# Patient Record
Sex: Female | Born: 1970 | Race: White | Hispanic: No | Marital: Married | Smoking: Former smoker
Health system: Southern US, Community
[De-identification: ages and names within clinical notes are randomized; demographics above are authoritative.]

## PROBLEM LIST (undated history)

## (undated) DIAGNOSIS — D649 Anemia, unspecified: Secondary | ICD-10-CM

## (undated) DIAGNOSIS — T7840XA Allergy, unspecified, initial encounter: Secondary | ICD-10-CM

## (undated) DIAGNOSIS — E210 Primary hyperparathyroidism: Secondary | ICD-10-CM

## (undated) DIAGNOSIS — R202 Paresthesia of skin: Secondary | ICD-10-CM

## (undated) DIAGNOSIS — K219 Gastro-esophageal reflux disease without esophagitis: Secondary | ICD-10-CM

## (undated) DIAGNOSIS — R011 Cardiac murmur, unspecified: Secondary | ICD-10-CM

## (undated) DIAGNOSIS — F419 Anxiety disorder, unspecified: Secondary | ICD-10-CM

## (undated) DIAGNOSIS — G43909 Migraine, unspecified, not intractable, without status migrainosus: Secondary | ICD-10-CM

## (undated) DIAGNOSIS — C73 Malignant neoplasm of thyroid gland: Secondary | ICD-10-CM

## (undated) DIAGNOSIS — R768 Other specified abnormal immunological findings in serum: Secondary | ICD-10-CM

## (undated) DIAGNOSIS — A6000 Herpesviral infection of urogenital system, unspecified: Secondary | ICD-10-CM

## (undated) DIAGNOSIS — E162 Hypoglycemia, unspecified: Secondary | ICD-10-CM

## (undated) DIAGNOSIS — R7689 Other specified abnormal immunological findings in serum: Secondary | ICD-10-CM

## (undated) DIAGNOSIS — I341 Nonrheumatic mitral (valve) prolapse: Secondary | ICD-10-CM

## (undated) HISTORY — DX: Primary hyperparathyroidism: E21.0

## (undated) HISTORY — DX: Hypoglycemia, unspecified: E16.2

## (undated) HISTORY — PX: FRACTURE SURGERY: SHX138

## (undated) HISTORY — DX: Malignant neoplasm of thyroid gland: C73

## (undated) HISTORY — DX: Allergy, unspecified, initial encounter: T78.40XA

## (undated) HISTORY — DX: Paresthesia of skin: R20.2

## (undated) HISTORY — PX: EYE SURGERY: SHX253

## (undated) HISTORY — DX: Migraine, unspecified, not intractable, without status migrainosus: G43.909

## (undated) HISTORY — DX: Anxiety disorder, unspecified: F41.9

## (undated) HISTORY — DX: Other specified abnormal immunological findings in serum: R76.89

## (undated) HISTORY — DX: Nonrheumatic mitral (valve) prolapse: I34.1

## (undated) HISTORY — DX: Gastro-esophageal reflux disease without esophagitis: K21.9

## (undated) HISTORY — DX: Other specified abnormal immunological findings in serum: R76.8

## (undated) HISTORY — DX: Cardiac murmur, unspecified: R01.1

## (undated) HISTORY — PX: URETHRA SURGERY: SHX824

## (undated) HISTORY — DX: Anemia, unspecified: D64.9

## (undated) HISTORY — DX: Herpesviral infection of urogenital system, unspecified: A60.00

---
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
## 2010-02-03 ENCOUNTER — Encounter
Admission: RE | Admit: 2010-02-03 | Discharge: 2010-02-03 | Payer: Self-pay | Source: Home / Self Care | Attending: Internal Medicine | Admitting: Internal Medicine

## 2010-03-14 ENCOUNTER — Institutional Professional Consult (permissible substitution) (INDEPENDENT_AMBULATORY_CARE_PROVIDER_SITE_OTHER): Payer: BC Managed Care – PPO | Admitting: Cardiology

## 2010-03-14 DIAGNOSIS — R079 Chest pain, unspecified: Secondary | ICD-10-CM

## 2010-03-14 DIAGNOSIS — I059 Rheumatic mitral valve disease, unspecified: Secondary | ICD-10-CM

## 2010-03-22 ENCOUNTER — Other Ambulatory Visit (HOSPITAL_COMMUNITY): Payer: BC Managed Care – PPO

## 2010-03-29 ENCOUNTER — Other Ambulatory Visit (HOSPITAL_COMMUNITY): Payer: BC Managed Care – PPO

## 2010-04-14 ENCOUNTER — Ambulatory Visit (HOSPITAL_COMMUNITY): Payer: BC Managed Care – PPO | Attending: Cardiology

## 2010-04-14 DIAGNOSIS — J45909 Unspecified asthma, uncomplicated: Secondary | ICD-10-CM | POA: Insufficient documentation

## 2010-04-14 DIAGNOSIS — R072 Precordial pain: Secondary | ICD-10-CM

## 2010-04-14 DIAGNOSIS — R079 Chest pain, unspecified: Secondary | ICD-10-CM | POA: Insufficient documentation

## 2010-09-06 ENCOUNTER — Encounter: Payer: Self-pay | Admitting: Internal Medicine

## 2010-09-06 ENCOUNTER — Ambulatory Visit (INDEPENDENT_AMBULATORY_CARE_PROVIDER_SITE_OTHER): Payer: BC Managed Care – PPO | Admitting: Internal Medicine

## 2010-09-06 DIAGNOSIS — J45909 Unspecified asthma, uncomplicated: Secondary | ICD-10-CM

## 2010-09-06 DIAGNOSIS — Z0000 Encounter for general adult medical examination without abnormal findings: Secondary | ICD-10-CM

## 2010-09-06 DIAGNOSIS — K219 Gastro-esophageal reflux disease without esophagitis: Secondary | ICD-10-CM

## 2010-09-06 LAB — HEPATIC FUNCTION PANEL
ALT: 25 U/L (ref 0–35)
Alkaline Phosphatase: 45 U/L (ref 39–117)
Bilirubin, Direct: 0.1 mg/dL (ref 0.0–0.3)
Total Protein: 7.4 g/dL (ref 6.0–8.3)

## 2010-09-06 LAB — CBC WITH DIFFERENTIAL/PLATELET
Eosinophils Absolute: 0 10*3/uL (ref 0.0–0.7)
Eosinophils Relative: 1 % (ref 0.0–5.0)
Lymphocytes Relative: 38.7 % (ref 12.0–46.0)
MCV: 92.5 fl (ref 78.0–100.0)
Monocytes Absolute: 0.4 10*3/uL (ref 0.1–1.0)
Neutrophils Relative %: 50.1 % (ref 43.0–77.0)
Platelets: 170 10*3/uL (ref 150.0–400.0)
WBC: 3.9 10*3/uL — ABNORMAL LOW (ref 4.5–10.5)

## 2010-09-06 LAB — BASIC METABOLIC PANEL
BUN: 14 mg/dL (ref 6–23)
Creatinine, Ser: 0.8 mg/dL (ref 0.4–1.2)
GFR: 79.92 mL/min (ref >60.00)

## 2010-09-06 LAB — LIPID PANEL
Cholesterol: 165 mg/dL (ref 0–200)
LDL Cholesterol: 94 mg/dL (ref 0–99)
Triglycerides: 60 mg/dL (ref 0.0–149.0)

## 2010-09-06 MED ORDER — RANITIDINE HCL 150 MG PO TABS: 150.0000 mg | ORAL_TABLET | Freq: Two times a day (BID) | ORAL | Status: DC

## 2010-09-06 NOTE — Progress Notes (Signed)
Addended byMarga Melnick F on: 09/06/2010 10:37 AM   Modules accepted: Orders

## 2010-09-06 NOTE — Patient Instructions (Addendum)
The triggers for dyspepsia or "heart burn"  include stress; the "aspirin family" ; alcohol; peppermint; and caffeine (coffee, tea, cola, and chocolate). The aspirin family would include aspirin and the nonsteroidal agents such as ibuprofen &  Naproxen. Tylenol would not cause reflux. If having dyspepsia ; food & drink should be avoided for @ least 2 hours before going to bed.  Please consider   scheduling fasting Labs : BMET,Lipids, hepatic panel, CBC & dif, TSH (V70.0) Preventive Health Care: Exercise  30-45  minutes a day, 3-4 days a week. Walking is especially valuable in preventing Osteoporosis. Eat a low-fat diet with lots of fruits and vegetables, up to 7-9 servings per day. Avoid obesity; your goal = waist less than 35 inches.Consume less than 30 grams of sugar per day from foods & drinks with High Fructose Corn Syrup as #1,2,3 or #4 on label. Stop present acid reducer & employ Ranitidine before b'fast & eve meal.

## 2010-09-06 NOTE — Progress Notes (Signed)
Subjective:    Patient ID: Cathy Galvan, female    DOB: 30-Apr-1970, 40 y.o.   MRN: [id number]  HPI  Mrs. Levels  is here for a physical;acute issues include asthma. She began to have reactive airways disease symptoms 2 years ago in the setting of extrinsic rhinoconjunctivitis symptoms. The symptoms were exacerbated after she got married and was exposed to her husband's cat. At this time she used an inhaled steroid. She des not have to use  rescue inhaler. The cat  has been removed from the home environment.  She believes that the inhaled steroid is causing heartburn. She denies any significant abdominal pain, dysphagia, melena, or rectal bleeding. Weight loss of 17#  has been purposeful.      Review of Systems Patient reports no vision/ hearing  changes, adenopathy,fever, weight change,  persistant / recurrent hoarseness , swallowing issues, chest pain,palpitations,edema,persistant /recurrent cough, hemoptysis, dyspnea( rest/ exertional/paroxysmal nocturnal),   bowel changes,GU symptoms(dysuria, hematuria,pyuria, incontinence ), Gyn symptoms(abnormal  bleeding , pain),   focal weakness, numbness & tingling, skin/hair /nail changes,abnormal bruising or bleeding, anxiety,or depression.  She describes a past medical history of leukopenia as a child; this resolved without treatment. She's also had syncope related to "chemicals"; she is unsure as to the exact process involved. She has had some reactive hypoglycemia with associated anxiety.     Objective:   Physical Exam Gen.: Healthy and well-nourished in appearance. Alert, appropriate and cooperative throughout exam. Head: Normocephalic without obvious abnormalities Eyes: No corneal or conjunctival inflammation noted. Pupils equal round reactive to light and accommodation. Fundal exam is benign without hemorrhages, exudate, papilledema. Extraocular motion: decreased lateral rotation OS Ears: External  ear exam reveals no significant lesions or  deformities. Canals clear .TMs normal.  Nose: External nasal exam reveals no deformity or inflammation. Nasal mucosa are pink and moist. No lesions or exudates noted. Septum normal Mouth: Oral mucosa and oropharynx reveal no lesions or exudates. Teeth in good repair. Neck: No deformities, masses, or tenderness noted. Range of motion & . Thyroid normal. Lungs: Normal respiratory effort; chest expands symmetrically. Lungs are clear to auscultation without rales, wheezes, or increased work of breathing. Heart: Normal rate and rhythm. Normal S1 and S2. No gallop, click, or rub. Soft S4 w/o  murmur. Abdomen: Bowel sounds normal; abdomen soft and nontender. No masses, organomegaly or hernias noted. Genitalia: [employer]   .                                                                                   Musculoskeletal/extremities: No deformity or scoliosis noted of  the thoracic or lumbar spine. No clubbing, cyanosis, edema, or deformity noted. Range of motion  normal .Tone & strength  normal.Joints normal. Nail health  good. Vascular: Carotid, radial artery, dorsalis pedis and  posterior tibial pulses are full and equal. No bruits present. Neurologic: Alert and oriented x3. Deep tendon reflexes symmetrical and normal.          Skin: Intact without suspicious lesions or rashes. Lymph: No cervical, axillary  lymphadenopathy present. Psych: Mood and affect are normal. Normally interactive  Assessment & Plan:  #1 comprehensive physical exam; no acute findings #2 see Problem List with Assessments & Recommendations  #3 GERD; this is temporally related to the use of the inhaled steroids. The usual triggers for reflux and treatment of such was discussed. Plan: see Orders & Recommendations

## 2010-09-22 ENCOUNTER — Telehealth: Payer: Self-pay | Admitting: Internal Medicine

## 2010-09-22 NOTE — Telephone Encounter (Signed)
Pt called says that gerd symptoms aren't better has been using zantac and just isn't helping. Says she was on prevacid in the past and it worked well would like to go back on it.  [facility] [address]  Left msg for pt to return call need dose of prevacid she was on in the past.

## 2010-09-22 NOTE — Telephone Encounter (Signed)
Pt returned called says that she was actually on famotidine 20 mg twice daily says it help but symptoms weren't completely resolved would like to know what she could take once daily daily that will fully control symptoms can't deal w/ reflux zantac not helping at all.

## 2010-09-23 MED ORDER — ESOMEPRAZOLE MAGNESIUM 40 MG PO CPDR: 40.0000 mg | DELAYED_RELEASE_CAPSULE | Freq: Every day | ORAL | Status: DC

## 2010-09-23 NOTE — Telephone Encounter (Signed)
Left pt detailed message of new rx and to call with any questions or concerns.

## 2010-09-23 NOTE — Telephone Encounter (Signed)
Nexium 40 mg daily dispense 30, one 30 minutes before breakfast

## 2010-10-05 ENCOUNTER — Telehealth: Payer: Self-pay | Admitting: *Deleted

## 2010-10-05 NOTE — Telephone Encounter (Signed)
Pt c/o face and lip twitching since being made aware that potassium was low. Pt indicated that after doing some research she found reviews that states these symptoms could be related to low potassium. Pt notes that she has made some dietary changes to increase her potassium. Pt has also purchase some OTC potassium 586 mg and would like to know if it is ok to take. Pt is also c/o increase hair loss especial around hair line. Pt denies family history of alopecia.

## 2010-10-05 NOTE — Telephone Encounter (Signed)
Recheck potassium after 2 weeks of the over-the-counter supplement ( 276.8). Thyroid test was normal. If the hair issues persist, dermatologist should be consulted to rule out a scalp condition.

## 2010-10-06 NOTE — Telephone Encounter (Signed)
Pt called and is aware of Dr. Frederik Pear note.  Pt states that she will start potassium this weekend.  Pt states she took 1 tablet and had diarrhea.  Pt instructed to start potassium and if diarrhea worsens and does not taper off to contact physician.

## 2010-10-06 NOTE — Telephone Encounter (Signed)
Left message to call office

## 2010-10-21 ENCOUNTER — Encounter: Payer: Self-pay | Admitting: Internal Medicine

## 2010-10-21 ENCOUNTER — Encounter: Payer: Self-pay | Admitting: Gastroenterology

## 2010-10-21 ENCOUNTER — Ambulatory Visit (INDEPENDENT_AMBULATORY_CARE_PROVIDER_SITE_OTHER): Payer: BC Managed Care – PPO | Admitting: Internal Medicine

## 2010-10-21 DIAGNOSIS — R198 Other specified symptoms and signs involving the digestive system and abdomen: Secondary | ICD-10-CM

## 2010-10-21 DIAGNOSIS — R1032 Left lower quadrant pain: Secondary | ICD-10-CM

## 2010-10-21 DIAGNOSIS — R194 Change in bowel habit: Secondary | ICD-10-CM

## 2010-10-21 DIAGNOSIS — Z800 Family history of malignant neoplasm of digestive organs: Secondary | ICD-10-CM

## 2010-10-21 DIAGNOSIS — E876 Hypokalemia: Secondary | ICD-10-CM

## 2010-10-21 LAB — POCT URINALYSIS DIPSTICK
Leukocytes, UA: NEGATIVE
Nitrite, UA: NEGATIVE
Protein, UA: NEGATIVE
pH, UA: 7

## 2010-10-21 NOTE — Progress Notes (Signed)
  Subjective:    Patient ID: Cathy Galvan, female    DOB: 06-18-1970, 40 y.o.   MRN: [id number]  HPI FLANK  PAIN  : Location: [facility] Lorenza Chick line  Onset: 2 weeks ago after  Nexium begun  Radiation: no but also some intermittent RLQ discomfort  Severity: up to 10 Quality: "gushing" sensation "like internal bleeding"  Duration: minutes  Better with: off Nexium  Worse with: Gaviscon Symptoms Nausea/Vomiting: no  Diarrhea: no  Constipation: yes, X 1 month ; ? Due to Aluminum in antacids "I take a ton of antacids".She believes she has taken magnesium containing preparations Melena/BRBPR: no  Hematemesis: no  Anorexia: no  Fever/Chills: no  Dysuria/hematuria/pyuria: no  Rash: no  Wt loss: yes, 4-5# intentionally  EtOH use: no  NSAIDs/ASA: no  LMP: 2 days ago; last Gyn appt > 1 yr Vaginal bleeding: no   Past Surgeries: no GI procedures FH: PGF colon cancer in his 17s PMH of IBS & colitis with stress & caffeine (self diagnosis)       Review of Systems  She had noted some trembling of her eyes and her lip; this responded to over-the-counter potassium supplement as well as increasing potassium-containing foods.  She has had some bloating as well as ongoing reflux symptoms  She's noted some hair loss at the anterior temples; she's had no nail changes. She's had isolated skin lesions which she questions as  bruising     Objective:   Physical Exam General appearance is one of good health and nourishment w/o distress.  Eyes: No conjunctival inflammation or scleral icterus is present. Resting OD esotropia  Neck: Thyroid is not enlarged; no nodules are present  Oral exam: Dental hygiene is good; lips and gums are healthy appearing.There is no oropharyngeal erythema or exudate noted.   Heart:  Normal rate and regular rhythm. S1 and S2 normal without gallop, murmur, click, rub .S4   Lungs:Chest clear to auscultation; no wheezes, rhonchi,rales ,or rubs present.No increased work  of breathing.   Abdomen: bowel sounds normal, soft and non-tender without masses, organomegaly or hernias noted.  No guarding or rebound .  There is no tenderness to percussion over the posterior flanks.  Skin:Warm & dry.  Intact without suspicious lesions or rashes ; no jaundice or tenting. There are faintly erythematous lesions over the left thigh which  have a irregular border. The largest lesion is 12 x 9 mm. There is no change in temperature and the lesions are nontender. They do not blanch.  Lymphatic: No lymphadenopathy is noted about the head, neck, axilla, or inguinal areas.              Assessment & Plan:  #1 abdominal discomfort left lower quadrant greater than the right. Possible irritable bowel but she does need an updated gynecological evaluation to assess the ovaries.  #2 constipation, chronic  #3 hypokalemia, possibly related to the use of a laxative-type agents  #4 dermatitis rule out fungal etiology  #5 hair loss; TSH was ideal at 1.97 in August. This may represent cyclical hair loss, found in women as thyroid function is normal  Plan: See orders and recommendations.

## 2010-10-21 NOTE — Patient Instructions (Addendum)
The triggers for reflux, dyspepsia or "heart burn"  include stress; the "aspirin family" ; alcohol; peppermint; and caffeine (coffee, tea, cola, and chocolate). The aspirin family would include aspirin and the nonsteroidal agents such as ibuprofen &  Naproxen. Tylenol would not cause reflux. If having dyspepsia ; food & drink should be avoided for @ least 2 hours before going to bed.   Use over-the-counter Tagamet as a trial for reflux until seen by gastroenterology.  Please review the notes and make additions or corrections as needed. Take these to the  GI appointment. Also schedule follow appointment with the gynecologist.

## 2010-10-28 ENCOUNTER — Other Ambulatory Visit (INDEPENDENT_AMBULATORY_CARE_PROVIDER_SITE_OTHER): Payer: BC Managed Care – PPO

## 2010-10-28 DIAGNOSIS — Z1211 Encounter for screening for malignant neoplasm of colon: Secondary | ICD-10-CM

## 2010-10-28 LAB — HEMOCCULT GUIAC POC 1CARD (OFFICE): Card #3 Fecal Occult Blood, POC: NEGATIVE

## 2010-10-28 NOTE — Progress Notes (Signed)
Labs only

## 2010-11-15 ENCOUNTER — Ambulatory Visit: Payer: BC Managed Care – PPO | Admitting: Gastroenterology

## 2010-12-02 ENCOUNTER — Encounter: Payer: Self-pay | Admitting: Gastroenterology

## 2010-12-02 ENCOUNTER — Other Ambulatory Visit: Payer: Self-pay | Admitting: Gastroenterology

## 2010-12-02 ENCOUNTER — Ambulatory Visit (INDEPENDENT_AMBULATORY_CARE_PROVIDER_SITE_OTHER): Payer: BC Managed Care – PPO | Admitting: Gastroenterology

## 2010-12-02 VITALS — BP 92/60 | HR 64 | Ht 68.0 in | Wt 185.0 lb

## 2010-12-02 DIAGNOSIS — R1012 Left upper quadrant pain: Secondary | ICD-10-CM

## 2010-12-02 DIAGNOSIS — K219 Gastro-esophageal reflux disease without esophagitis: Secondary | ICD-10-CM

## 2010-12-02 MED ORDER — HYOSCYAMINE SULFATE 0.125 MG SL SUBL: SUBLINGUAL_TABLET | SUBLINGUAL | Status: DC

## 2010-12-02 MED ORDER — RABEPRAZOLE SODIUM 20 MG PO TBEC: 20.0000 mg | DELAYED_RELEASE_TABLET | Freq: Every day | ORAL | Status: AC

## 2010-12-02 NOTE — Patient Instructions (Signed)
Your prescriptions have been sent to your pharmacy.  Patient advised to avoid spicy, acidic, citrus, chocolate, mints, fruit and fruit juices.  Limit the intake of caffeine, alcohol and Soda.  Don't exercise too soon after eating.  Don't lie down within 3-4 hours of eating.  Elevate the head of your bed. cc: Marga Melnick, MD

## 2010-12-02 NOTE — Progress Notes (Signed)
History of Present Illness: This is a 39 year old female who has frequent chest pain and substernal burning chest present for many years. Her symptoms occasionally occur at night and following certain foods. She developed asthma that began after an allergy to cats developed in her reflux symptoms worsened. She has since removed the cat from her residence however the asthma symptoms and reflux symptoms persist. If she was treated with Prilosec with helped her symptoms that led to a rash Zantac his left eye headaches Nexium controls her symptoms that led to some abdominal pain. In addition she has occasional left upper quadrant discomfort associated with a rumbling sensation. She has had an intentional 20 pound weight loss without improvement in her reflux symptoms. Blood work performed in August unremarkable. Denies weight loss, abdominal pain, constipation, diarrhea, change in stool caliber, melena, hematochezia, nausea, vomiting, dysphagia, reflux symptoms, chest pain.  Review of Systems: Pertinent positive and negative review of systems were noted in the above HPI section. All other review of systems were otherwise negative.  Current Medications, Allergies, Past Medical History, Past Surgical History, Family History and Social History were reviewed in [employer] record.  Physical Exam: General: Well developed , well nourished, no acute distress Head: Normocephalic and atraumatic Eyes:  sclerae anicteric, EOMI Ears: Normal auditory acuity Mouth: No deformity or lesions Neck: Supple, no masses or thyromegaly Lungs: Clear throughout to auscultation Heart: Regular rate and rhythm; no murmurs, rubs or bruits Abdomen: Soft, non tender and non distended. No masses, hepatosplenomegaly or hernias noted. Normal Bowel sounds Musculoskeletal: Symmetrical with no gross deformities  Skin: No lesions on visible extremities Pulses:  Normal pulses noted Extremities: No clubbing, cyanosis,  edema or deformities noted Neurological: Alert oriented x 4, grossly nonfocal Cervical Nodes:  No significant cervical adenopathy Inguinal Nodes: No significant inguinal adenopathy Psychological:  Alert and cooperative. Anxious.  Assessment and Recommendations:  1. GERD. Begin all standard antireflux measures including elevation of the head of her bed. Begin AcipHex 20 mg daily. The risks, benefits, and alternatives to endoscopy with possible biopsy and possible dilation were discussed with the patient and they consent to proceed.   2. Left upper quadrant discomfort and rumbling. Possible intestinal spasms. Trial of hyoscyamine 1-2 every 4 hours as needed.

## 2010-12-07 ENCOUNTER — Telehealth: Payer: Self-pay | Admitting: Gastroenterology

## 2010-12-08 NOTE — Telephone Encounter (Signed)
Aciphex has been approved through [employer]. Samples were left at the front desk for patient until PA goes through.

## 2011-08-22 ENCOUNTER — Other Ambulatory Visit: Payer: Self-pay | Admitting: Obstetrics and Gynecology

## 2011-08-22 DIAGNOSIS — R928 Other abnormal and inconclusive findings on diagnostic imaging of breast: Secondary | ICD-10-CM

## 2011-08-25 ENCOUNTER — Other Ambulatory Visit: Payer: Self-pay | Admitting: Obstetrics and Gynecology

## 2011-08-25 ENCOUNTER — Ambulatory Visit
Admission: RE | Admit: 2011-08-25 | Discharge: 2011-08-25 | Disposition: A | Discharge status: Home / Self Care | Payer: BC Managed Care – PPO | Source: Ambulatory Visit | Attending: Obstetrics and Gynecology | Admitting: Obstetrics and Gynecology

## 2011-08-25 ENCOUNTER — Other Ambulatory Visit (HOSPITAL_COMMUNITY)
Admission: RE | Admit: 2011-08-25 | Discharge: 2011-08-25 | Disposition: A | Discharge status: Home / Self Care | Payer: BC Managed Care – PPO | Source: Ambulatory Visit | Attending: Diagnostic Radiology | Admitting: Diagnostic Radiology

## 2011-08-25 DIAGNOSIS — N63 Unspecified lump in unspecified breast: Secondary | ICD-10-CM | POA: Insufficient documentation

## 2011-08-25 DIAGNOSIS — R928 Other abnormal and inconclusive findings on diagnostic imaging of breast: Secondary | ICD-10-CM

## 2011-10-11 ENCOUNTER — Telehealth: Payer: Self-pay | Admitting: Internal Medicine

## 2011-10-11 ENCOUNTER — Encounter: Payer: BC Managed Care – PPO | Admitting: Internal Medicine

## 2011-10-11 NOTE — Telephone Encounter (Signed)
OK 

## 2011-10-11 NOTE — Telephone Encounter (Signed)
Okay with me if okay with PCP

## 2011-10-11 NOTE — Telephone Encounter (Signed)
fyi pt changing to [facility] as her spouse sees him and she wants them both to go to same PCP

## 2011-10-12 ENCOUNTER — Telehealth: Payer: Self-pay | Admitting: Internal Medicine

## 2011-10-12 ENCOUNTER — Other Ambulatory Visit: Payer: BC Managed Care – PPO

## 2011-10-12 NOTE — Telephone Encounter (Signed)
Notified pt and she voices understanding. 

## 2011-10-12 NOTE — Telephone Encounter (Signed)
Left message at below # to return my call. 

## 2011-10-12 NOTE — Telephone Encounter (Signed)
Pt advised pcp changed in system

## 2011-10-12 NOTE — Telephone Encounter (Signed)
I recommend labs at the time of the visit. Her labs last year were okay, there is no urgency to do LABS. Please schedule a physical at her earliest convenience

## 2011-10-12 NOTE — Telephone Encounter (Signed)
Pt wanted to change pcp this has been approved by [employer]  Due to pts. Work schedule she is requesting a lab work up without a CPE. Pt stated her schedule may change in February to which she can schedule a cpe Please review and advise  CB# [phone]

## 2011-12-22 ENCOUNTER — Encounter: Payer: Self-pay | Admitting: Internal Medicine

## 2011-12-22 ENCOUNTER — Ambulatory Visit (INDEPENDENT_AMBULATORY_CARE_PROVIDER_SITE_OTHER): Payer: BC Managed Care – PPO | Admitting: Internal Medicine

## 2011-12-22 VITALS — BP 102/70 | HR 76 | Temp 98.2°F | Ht 68.75 in

## 2011-12-22 DIAGNOSIS — Z0000 Encounter for general adult medical examination without abnormal findings: Secondary | ICD-10-CM

## 2011-12-22 DIAGNOSIS — A6000 Herpesviral infection of urogenital system, unspecified: Secondary | ICD-10-CM

## 2011-12-22 DIAGNOSIS — J45909 Unspecified asthma, uncomplicated: Secondary | ICD-10-CM | POA: Insufficient documentation

## 2011-12-22 DIAGNOSIS — K219 Gastro-esophageal reflux disease without esophagitis: Secondary | ICD-10-CM | POA: Insufficient documentation

## 2011-12-22 MED ORDER — VALACYCLOVIR HCL 500 MG PO TABS: 500.0000 mg | ORAL_TABLET | Freq: Two times a day (BID) | ORAL | Status: DC

## 2011-12-22 MED ORDER — RABEPRAZOLE SODIUM 20 MG PO TBEC: 20.0000 mg | DELAYED_RELEASE_TABLET | Freq: Every day | ORAL | Status: DC

## 2011-12-22 NOTE — Progress Notes (Signed)
  Subjective:    Patient ID: Cathy Galvan, female    DOB: Jan 17, 1971, 41 y.o.   MRN: [id number]  HPI Complete physical exam, new patient to me, in addition to his physical with discuss several other issues, see assessment and plan.  Past Medical History  Diagnosis Date  . Hypoglycemia   . Asthma     adult onset ; no childhood astma  . GERD (gastroesophageal reflux disease)     negative cardiac workup for chest pain 03/12  . MVP (mitral valve prolapse) 1973    as per ECHO ; not found on ECHO 03/12  . Herpes genitalis   . Migraine     rarely   Past Surgical History  Procedure Date  . Eye surgery     DUANE syndrome-Left eye; decreased meial &lateral rotation OS  . Fracture surgery     Left Arm   . Urethra surgery     dilation    History   Social History  . Marital Status: Married    Spouse Name: N/A    Number of Children: 0  . Years of Education: N/A   Occupational History  . instructor Nurse, children[employer]    Social History Main Topics  . Smoking status: Former Smoker    Quit date: 01/30/1993  . Smokeless tobacco: Never Used     Comment: 17 years ago as of 2012  . Alcohol Use: No  . Drug Use: No  . Sexually Active: Not on file   Other Topics Concern  . Not on file   Social History Narrative   Lives w/ husband    Family History  Problem Relation Age of Onset  . Diabetes Father   . Obesity Father   . Heart disease Maternal Grandfather     GP, uncle  . Colon cancer Paternal Grandfather     GF dx 53s  . Thyroid disease Paternal Aunt   . Hypertension Father     F, maternal side   . Hyperlipidemia Father   . Lung cancer Maternal Grandmother     smoker 40 + years   . Dementia Maternal Grandfather   . Breast cancer      aunt     Review of Systems Complaining of a headache for the last 48 hours, around the temples, not the worse of her life, headache are improved today. No fever or chills no CP or  shortness of breath No nausea, vomiting,  diarrhea. Asthma symptoms currently well controlled. GERD symptoms currently on TUMS only, take one daily, symptoms relatively well controlled. Request a refill on Valtrex for genital herpes.     Objective:   Physical Exam General -- alert, well-developed, and slightly overweight.   Neck --no thyromegaly , normal carotid pulse Lungs -- normal respiratory effort, no intercostal retractions, no accessory muscle use, and normal breath sounds.   Heart-- normal rate, regular rhythm, no murmur, and no gallop.   Abdomen--soft, non-tender, no distention, no masses, no HSM, no guarding, and no rigidity.   Extremities-- no pretibial edema bilaterally  Neurologic-- alert & oriented X3 and strength normal in all extremities. EOM: Limited mobility of the left eye Psych-- Cognition and judgment appear intact. Alert and cooperative with normal attention span and concentration.  not anxious appearing and not depressed appearing.      Assessment & Plan:  Complains of bilateral plantar pain, has seen a podiatrist before, has shoe inserts. Recommend stretching.

## 2011-12-22 NOTE — Assessment & Plan Note (Addendum)
Td-- 2008 per pt Had a flu shot Sees gyn GF had colon ca age ~ 80, her father is okay, she never had a Cscope : Recommend colon cancer screening, will start iFOB, if interested in a colonoscopy, let me know. Labs Diet and exercise discussed

## 2011-12-22 NOTE — Assessment & Plan Note (Addendum)
Relatively well controlled with TUMS daily, recommend trial with AcipHex (not allergic to it pr pt) She did notice that as soon as she stopped asmanex her symptoms improved significantly.

## 2011-12-22 NOTE — Assessment & Plan Note (Signed)
Sees Dr. Francie Massing, on allergy injections, intolerant to astmanex due to increase GERD symptoms, currently doing well.

## 2011-12-22 NOTE — Patient Instructions (Addendum)
Come back fasting: FLP, CMP, TSH,CBC-- dx v70

## 2011-12-22 NOTE — Assessment & Plan Note (Addendum)
Reports frequent problems with genital herpes, currently taking Valtrex 500 mg twice a day (use to only need 500 mg when Valtrex was branded) Plan-- RF, consider switch to prn

## 2011-12-24 ENCOUNTER — Encounter: Payer: Self-pay | Admitting: Internal Medicine

## 2011-12-25 ENCOUNTER — Other Ambulatory Visit: Payer: BC Managed Care – PPO

## 2012-01-03 ENCOUNTER — Other Ambulatory Visit: Payer: BC Managed Care – PPO

## 2012-01-05 ENCOUNTER — Other Ambulatory Visit: Payer: BC Managed Care – PPO

## 2012-01-17 ENCOUNTER — Other Ambulatory Visit (INDEPENDENT_AMBULATORY_CARE_PROVIDER_SITE_OTHER): Payer: BC Managed Care – PPO

## 2012-01-17 DIAGNOSIS — Z0000 Encounter for general adult medical examination without abnormal findings: Secondary | ICD-10-CM

## 2012-01-18 LAB — COMPREHENSIVE METABOLIC PANEL
CO2: 25 mEq/L (ref 19–32)
Calcium: 9 mg/dL (ref 8.4–10.5)
Creatinine, Ser: 0.7 mg/dL (ref 0.4–1.2)
GFR: 99.6 mL/min (ref >60.00)
Glucose, Bld: 79 mg/dL (ref 70–99)
Sodium: 137 mEq/L (ref 135–145)
Total Bilirubin: 0.5 mg/dL (ref 0.3–1.2)
Total Protein: 6.9 g/dL (ref 6.0–8.3)

## 2012-01-18 LAB — CBC WITH DIFFERENTIAL/PLATELET
Basophils Absolute: 0 10*3/uL (ref 0.0–0.1)
Hemoglobin: 13.6 g/dL (ref 12.0–15.0)
Lymphocytes Relative: 38.2 % (ref 12.0–46.0)
Monocytes Relative: 8.8 % (ref 3.0–12.0)
Neutro Abs: 1.8 10*3/uL (ref 1.4–7.7)
Neutrophils Relative %: 50.1 % (ref 43.0–77.0)
RDW: 12.7 % (ref 11.5–14.6)

## 2012-01-18 LAB — TSH: TSH: 2.18 u[IU]/mL (ref 0.35–5.50)

## 2012-01-18 LAB — LIPID PANEL
HDL: 50 mg/dL (ref >39.00)
Triglycerides: 46 mg/dL (ref 0.0–149.0)

## 2012-01-19 ENCOUNTER — Encounter: Payer: Self-pay | Admitting: *Deleted

## 2012-01-19 ENCOUNTER — Ambulatory Visit (INDEPENDENT_AMBULATORY_CARE_PROVIDER_SITE_OTHER): Payer: BC Managed Care – PPO | Admitting: *Deleted

## 2012-01-19 DIAGNOSIS — R195 Other fecal abnormalities: Secondary | ICD-10-CM

## 2012-01-19 DIAGNOSIS — Z0000 Encounter for general adult medical examination without abnormal findings: Secondary | ICD-10-CM

## 2012-01-19 LAB — FECAL OCCULT BLOOD, IMMUNOCHEMICAL: Fecal Occult Bld: POSITIVE

## 2012-02-02 ENCOUNTER — Telehealth: Payer: Self-pay | Admitting: Gastroenterology

## 2012-02-05 NOTE — Telephone Encounter (Signed)
Left message for patient to call back  

## 2012-02-06 NOTE — Telephone Encounter (Signed)
Patient scheduled for 02/19/12

## 2012-02-06 NOTE — Telephone Encounter (Signed)
Patient is referred by Dr. Drue Novel for rectal bleeding, blood in stool, and family history of colon cancer.

## 2012-02-06 NOTE — Telephone Encounter (Signed)
Left message for patient to call back  

## 2012-02-06 NOTE — Telephone Encounter (Signed)
Error

## 2012-02-08 ENCOUNTER — Other Ambulatory Visit: Payer: Self-pay | Admitting: *Deleted

## 2012-02-08 MED ORDER — VALACYCLOVIR HCL 500 MG PO TABS: 500.0000 mg | ORAL_TABLET | Freq: Two times a day (BID) | ORAL | Status: DC

## 2012-02-08 NOTE — Telephone Encounter (Signed)
Done, see Rx.

## 2012-02-08 NOTE — Telephone Encounter (Signed)
Refill done.  

## 2012-02-08 NOTE — Telephone Encounter (Signed)
Pl left VM requesting sample or Rx with BMN for Valtrex. Pt indicated that she is currently take the generic but it does not seem to work as good and when she took brand name. Pt would like brand name to be specified. Pt notes that she discuss this at [facility](12-22-11).Please advise

## 2012-02-13 ENCOUNTER — Telehealth: Payer: Self-pay | Admitting: Internal Medicine

## 2012-02-13 NOTE — Telephone Encounter (Signed)
lmovm for pt to return call.  

## 2012-02-13 NOTE — Telephone Encounter (Signed)
[facility] Triage Call Report Triage Record Num: [id number] Operator: Roselyn Meier Patient Name: Cathy Galvan Call Date & Time: 02/12/2012 6:18:56PM Patient Phone: [phone] PCP: Nolon Rod. Paz Patient Gender: Female PCP Fax : Patient DOB: [birth year] Practice Name: [facility] Reason for Call: Caller: Anthonella/Patient; PCP: Willow Ora; CB#: [phone]; Call regarding Lab results; Pt is calling because she is concerned about her WBC (drawn 01/17/12). The results were 3.6. Pt states she has always had a low WBC (in 2012 it was 3.9). Pt would like to make sure the doctor is aware of her history and last years WBC count. RN advised patient that when her labs are pulled it shows current and past results. Pt will still call the office in the AM to have message sent to Dr Drue Novel Protocol(s) Used: Office Note Recommended Outcome per Protocol: Information Noted and Sent to Office Reason for Outcome: Caller information to office

## 2012-02-13 NOTE — Telephone Encounter (Signed)
Advise patient, I have reviewed her CBCs, in fact WBCs are slightly low but all other  cell counts are normal. It is very unlikely that that represents something serious, my plan is to repeat a CBC periodically to be sure it is okay.

## 2012-02-13 NOTE — Telephone Encounter (Signed)
Please advise 

## 2012-02-14 NOTE — Telephone Encounter (Signed)
Left detailed msg on pt's vmail.  

## 2012-02-14 NOTE — Telephone Encounter (Signed)
I don't think any specific lifestyle change will help. Please arrange for office visit if she likes to discuss further.

## 2012-02-14 NOTE — Telephone Encounter (Signed)
Discussed with pt, she states that when she was younger she was told that she had leukemia. She said she has always a low WBC count & a week immune system. Pt states that she takes antihistamines regularly & she had read that this can lower her WBC. Pt would like to know if there Is there something that she can do (lifestyle, dietary changes) or is this something that she should be concerned about. Please advise.

## 2012-02-19 ENCOUNTER — Ambulatory Visit: Payer: BC Managed Care – PPO | Admitting: Gastroenterology

## 2012-02-27 ENCOUNTER — Encounter: Payer: Self-pay | Admitting: Gastroenterology

## 2012-02-27 ENCOUNTER — Ambulatory Visit (INDEPENDENT_AMBULATORY_CARE_PROVIDER_SITE_OTHER): Payer: BC Managed Care – PPO | Admitting: Gastroenterology

## 2012-02-27 VITALS — BP 106/60 | HR 64 | Ht 68.75 in

## 2012-02-27 DIAGNOSIS — Z8371 Family history of colonic polyps: Secondary | ICD-10-CM

## 2012-02-27 DIAGNOSIS — Z83719 Family history of colon polyps, unspecified: Secondary | ICD-10-CM

## 2012-02-27 DIAGNOSIS — K921 Melena: Secondary | ICD-10-CM

## 2012-02-27 DIAGNOSIS — K219 Gastro-esophageal reflux disease without esophagitis: Secondary | ICD-10-CM

## 2012-02-27 DIAGNOSIS — Z800 Family history of malignant neoplasm of digestive organs: Secondary | ICD-10-CM

## 2012-02-27 DIAGNOSIS — R109 Unspecified abdominal pain: Secondary | ICD-10-CM

## 2012-02-27 MED ORDER — PEG-KCL-NACL-NASULF-NA ASC-C 100 G PO SOLR: 1.0000 | Freq: Once | ORAL | Status: DC

## 2012-02-27 MED ORDER — RABEPRAZOLE SODIUM 20 MG PO TBEC: 20.0000 mg | DELAYED_RELEASE_TABLET | Freq: Every day | ORAL | Status: DC

## 2012-02-27 NOTE — Patient Instructions (Addendum)
You have been scheduled for an abdominal ultrasound at [facility] [address] on 03/04/12 at 9:30am. Please arrive 15 minutes prior to your appointment for registration. Make certain not to have anything to eat or drink 6 hours prior to your appointment. Should you need to reschedule your appointment, please contact radiology at [phone]. This test typically takes about 30 minutes to perform.  You have been scheduled for a colonoscopy with propofol. Please follow written instructions given to you at your visit today.  Please pick up your prep kit at the pharmacy within the next 1-3 days. If you use inhalers (even only as needed) or a CPAP machine, please bring them with you on the day of your procedure.  We have sent the following medications to your pharmacy for you to pick up at your convenience: Aciphex.  Patient advised to avoid spicy, acidic, citrus, chocolate, mints, fruit and fruit juices.  Limit the intake of caffeine, alcohol and Soda.  Don't exercise too soon after eating.  Don't lie down within 3-4 hours of eating.  Elevate the head of your bed.  Thank you for choosing me and [facility].  Venita Lick. Pleas Koch., MD., Clementeen Graham

## 2012-02-27 NOTE — Progress Notes (Signed)
History of Present Illness: This is a 42 year old female who relates intermittent small-volume bright red rectal bleeding for about 10 years. She had 10 consecutive days with bright red blood associated with bowel movements and then it abated. At times in the past she has noted rectal pain when she noted rectal bleeding. She relates her father has had multiple colon polyps removed. Her paternal grandfather had colon cancer in his 17s and her maternal uncle had colon cancer in his 31s. She relates a vague mid right-sided abdominal pain that tends to last for a few minutes at a time and appears to be unrelated to any digestive function. The symptoms have been intermittent for several years. She relates frequent reflux symptoms that worsens as she has gained weight she states she has reflux almost every night. AcipHex has worked well for the past but she's been reluctant to resume AcipHex. Denies weight loss, constipation, diarrhea, change in stool caliber, melena, nausea, vomiting, dysphagia, chest pain.  Current Medications, Allergies, Past Medical History, Past Surgical History, Family History and Social History were reviewed in [employer] record.  Physical Exam: pt refused to allow weight check General: Well developed , well nourished, no acute distress Head: Normocephalic and atraumatic Eyes:  sclerae anicteric, EOMI Ears: Normal auditory acuity Mouth: No deformity or lesions Lungs: Clear throughout to auscultation Heart: Regular rate and rhythm; no murmurs, rubs or bruits Abdomen: Soft, non tender and non distended. No masses, hepatosplenomegaly or hernias noted. Normal Bowel sounds Rectal: No internal or external lesions, Hemoccult negative brown stool the vault Musculoskeletal: Symmetrical with no gross deformities  Pulses:  Normal pulses noted Extremities: No clubbing, cyanosis, edema or deformities noted Neurological: Alert oriented x 4, grossly  nonfocal Psychological:  Alert and cooperative. Normal mood and affect  Assessment and Recommendations:  1. Hematochezia. Father with colon polyps. Paternal grandfather and paternal uncle with colon cancer. I suspect she has a benign anorectal source of bleeding however I recommended further evaluation with colonoscopy to exclude colorectal cancer, colon polyps and IBD. The patient is a little reluctant to schedule the procedure and prefers to postpone this for 6 or 7 weeks. The risks, benefits, and alternatives to colonoscopy with possible biopsy and possible polypectomy were discussed with the patient and they consent to proceed.   2. Right-sided abdominal pain. I suspect this might be musculoskeletal. Schedule abdominal ultrasound.  3. GERD. Inadequate symptom control. Standard antireflux measures and resume AcipHex 20 mg daily. I evaluated her in 2012 for the same problem and I recommended upper endoscopy however she elected not to proceed. If her symptoms do not come under good control on AcipHex and antireflux measures upper endoscopy would be reasonable.

## 2012-02-28 ENCOUNTER — Telehealth: Payer: Self-pay | Admitting: Gastroenterology

## 2012-02-29 NOTE — Telephone Encounter (Signed)
Patient wanted to discuss how to find out her cost of the Colonoscopy and she states she already called billing and could not get through. Told patient that is the right place to call along with her insurance company. She also states she can get the Colonoscopy done in [facility] for 81 dollars because they bill it as a office visit and wants to know if we accept outside procedure reports. Told patient that would be a transfer of care and to let us know if she does decide to do that so we can cancel her Colonoscopy. Pt agreed and is still thinking about doing that and will go ahead with the ultrasound already scheduled.

## 2012-03-04 ENCOUNTER — Other Ambulatory Visit: Payer: BC Managed Care – PPO

## 2012-03-11 ENCOUNTER — Ambulatory Visit
Admission: RE | Admit: 2012-03-11 | Discharge: 2012-03-11 | Disposition: A | Discharge status: Home / Self Care | Payer: BC Managed Care – PPO | Source: Ambulatory Visit | Attending: Gastroenterology | Admitting: Gastroenterology

## 2012-03-11 DIAGNOSIS — R109 Unspecified abdominal pain: Secondary | ICD-10-CM

## 2012-04-11 ENCOUNTER — Telehealth: Payer: Self-pay | Admitting: Gastroenterology

## 2012-04-11 ENCOUNTER — Telehealth: Payer: Self-pay

## 2012-04-11 NOTE — Telephone Encounter (Signed)
Cathy Galvan returned call to pt.  She states "I can feel my body crashing".  She said, "I feel light headed and dizzy".  Per the pt she has been drinking sodas and eating jello with sugar in them.  She has not began drinking the pre yet.  Merlene Laughter spoke with Alesia Banda, RN and she advised Merlene Laughter to instruct the pt to continue drinking apple juice, sodas and jello to raise her blood sugar. Merlene Laughter advised the pt.  Per the pt "I will try, but I know my body" and will call back if she needs to cancel.  The pt states that she did follow the instructions and is very concerned that she may have to pay the penalty for cancelling the procedure.  She said she did not want to cancel but would see how she does with her blood sugar and the prep.  She wanted Dr. Russella Dar to be aware of this situation.

## 2012-04-11 NOTE — Telephone Encounter (Signed)
Push high calorie clear liquids every 1-2 hours. If she cannot tolerate the liquid diet or the bowel prep we will not charge her for late cancellation.

## 2012-04-12 ENCOUNTER — Other Ambulatory Visit: Payer: Self-pay | Admitting: Gastroenterology

## 2012-04-12 ENCOUNTER — Ambulatory Visit (AMBULATORY_SURGERY_CENTER): Payer: BC Managed Care – PPO | Admitting: Gastroenterology

## 2012-04-12 ENCOUNTER — Encounter: Payer: Self-pay | Admitting: Gastroenterology

## 2012-04-12 VITALS — BP 106/60 | HR 61 | Temp 98.6°F | Resp 18 | Ht 68.75 in | Wt 189.0 lb

## 2012-04-12 DIAGNOSIS — D126 Benign neoplasm of colon, unspecified: Secondary | ICD-10-CM

## 2012-04-12 DIAGNOSIS — K921 Melena: Secondary | ICD-10-CM

## 2012-04-12 DIAGNOSIS — Z83719 Family history of colon polyps, unspecified: Secondary | ICD-10-CM

## 2012-04-12 DIAGNOSIS — Z8371 Family history of colonic polyps: Secondary | ICD-10-CM

## 2012-04-12 DIAGNOSIS — Z800 Family history of malignant neoplasm of digestive organs: Secondary | ICD-10-CM

## 2012-04-12 LAB — GLUCOSE, CAPILLARY: Glucose-Capillary: 77 mg/dL (ref 70–99)

## 2012-04-12 MED ORDER — DEXTROSE 5 % IV SOLN: INTRAVENOUS | Status: DC

## 2012-04-12 NOTE — Progress Notes (Signed)
Report to pacu rn, vss, bbs=clear 

## 2012-04-12 NOTE — Progress Notes (Signed)
No complaints noted in the recovery room. Maw  Patient did not experience any of the following events: a burn prior to discharge; a fall within the facility; wrong site/side/patient/procedure/implant event; or a hospital transfer or hospital admission upon discharge from the facility. (G8907) Patient did not have preoperative order for IV antibiotic SSI prophylaxis. (G8918)  

## 2012-04-12 NOTE — Patient Instructions (Addendum)
You may resume your current medications today.  Please call if any questions or concerns.     YOU HAD AN ENDOSCOPIC PROCEDURE TODAY AT THE [facility]: Refer to the procedure report that was given to you for any specific questions about what was found during the examination.  If the procedure report does not answer your questions, please call your gastroenterologist to clarify.  If you requested that your care partner not be given the details of your procedure findings, then the procedure report has been included in a sealed envelope for you to review at your convenience later.  YOU SHOULD EXPECT: Some feelings of bloating in the abdomen. Passage of more gas than usual.  Walking can help get rid of the air that was put into your GI tract during the procedure and reduce the bloating. If you had a lower endoscopy (such as a colonoscopy or flexible sigmoidoscopy) you may notice spotting of blood in your stool or on the toilet paper. If you underwent a bowel prep for your procedure, then you may not have a normal bowel movement for a few days.  DIET: Your first meal following the procedure should be a light meal and then it is ok to progress to your normal diet.  A half-sandwich or bowl of soup is an example of a good first meal.  Heavy or fried foods are harder to digest and may make you feel nauseous or bloated.  Likewise meals heavy in dairy and vegetables can cause extra gas to form and this can also increase the bloating.  Drink plenty of fluids but you should avoid alcoholic beverages for 24 hours.  ACTIVITY: Your care partner should take you home directly after the procedure.  You should plan to take it easy, moving slowly for the rest of the day.  You can resume normal activity the day after the procedure however you should NOT DRIVE or use heavy machinery for 24 hours (because of the sedation medicines used during the test).    SYMPTOMS TO REPORT IMMEDIATELY: A gastroenterologist can  be reached at any hour.  During normal business hours, 8:30 AM to 5:00 PM Monday through Friday, call [phone].  After hours and on weekends, please call the GI answering service at [phone] who will take a message and have the physician on call contact you.   Following lower endoscopy (colonoscopy or flexible sigmoidoscopy):  Excessive amounts of blood in the stool  Significant tenderness or worsening of abdominal pains  Swelling of the abdomen that is new, acute  Fever of 100F or higher    FOLLOW UP: If any biopsies were taken you will be contacted by phone or by letter within the next 1-3 weeks.  Call your gastroenterologist if you have not heard about the biopsies in 3 weeks.  Our staff will call the home number listed on your records the next business day following your procedure to check on you and address any questions or concerns that you may have at that time regarding the information given to you following your procedure. This is a courtesy call and so if there is no answer at the home number and we have not heard from you through the emergency physician on call, we will assume that you have returned to your regular daily activities without incident.  SIGNATURES/CONFIDENTIALITY: You and/or your care partner have signed paperwork which will be entered into your electronic medical record.  These signatures attest to the fact that that the  information above on your After Visit Summary has been reviewed and is understood.  Full responsibility of the confidentiality of this discharge information lies with you and/or your care-partner.

## 2012-04-12 NOTE — Progress Notes (Signed)
Pt states she has a hx of hypoglycemia.  She is slow to respond to questions but states she is just tired.  Blood sugar checked- 77.  D5 IV hung at [facility] and Anselm Pancoast, RN (recovery room RN) aware as well as CRNA

## 2012-04-12 NOTE — Progress Notes (Addendum)
Called to room to assist during endoscopic procedure.  Patient ID and intended procedure confirmed with present staff. Received instructions for my participation in the procedure from the performing physician. ewm 

## 2012-04-12 NOTE — Op Note (Signed)
[facility] [address] [place], [address]   COLONOSCOPY PROCEDURE REPORT  PATIENT: Cathy Galvan, Cathy Galvan  MR#: [id number] BIRTHDATE: September 29, 1970 , 41  yrs. old GENDER: Female ENDOSCOPIST: Meryl Dare, MD, [facility] PROCEDURE DATE:  04/12/2012 PROCEDURE:   Colonoscopy with biopsy ASA CLASS:   Class II INDICATIONS:hematochezia, Patient's family history of colon cancer, distant relatives, and Patient's family history of colon polyps. MEDICATIONS: MAC sedation, administered by CRNA and propofol (Diprivan) 250mg  IV DESCRIPTION OF PROCEDURE:   After the risks benefits and alternatives of the procedure were thoroughly explained, informed consent was obtained.  A digital rectal exam revealed no abnormalities of the rectum.   The LB [id number]  endoscope was introduced through the anus and advanced to the cecum, which was identified by both the appendix and ileocecal valve. No adverse events experienced.   The quality of the prep was excellent, using MoviPrep  The instrument was then slowly withdrawn as the colon was fully examined.  COLON FINDINGS: Nonspecdific erosion was found in the ascending colon.  Multiple biopsies of the area were performed.   The colon was otherwise normal.  There was no diverticulosis, inflammation, polyps or cancers unless previously stated.  Retroflexed views revealed no abnormalities. The time to cecum=2 minutes 25 seconds. Withdrawal time=9 minutes 05 seconds.  The scope was withdrawn and the procedure completed. COMPLICATIONS: There were no complications.  ENDOSCOPIC IMPRESSION: 1.   Erosion in the ascending colon; multiple biopsies performed 2.   The colon was otherwise normal  RECOMMENDATIONS: 1.  Await pathology results 2.  Repeat Colonoscopy in 5 years.   eSigned:  Meryl Dare, MD, [facility] 04/12/2012 11:23 AM

## 2012-04-12 NOTE — Telephone Encounter (Signed)
Phone call back to pt.  She was able to push fluid and stayed on the clear liquid diet and able to drink prep.  She is planning to come colonoscopy today. Cathy Galvan

## 2012-04-15 ENCOUNTER — Telehealth: Payer: Self-pay

## 2012-04-15 NOTE — Telephone Encounter (Signed)
  Follow up Call-  Call back number 04/12/2012  Post procedure Call Back phone  # [phone]  Permission to leave phone message Yes     Patient questions:  Do you have a fever, pain , or abdominal swelling? yes Pain Score  2 *  "Soreness dull aching abdominal pain"  Have you tolerated food without any problems? yes  Have you been able to return to your normal activities? yes  Do you have any questions about your discharge instructions: Diet   no Medications  no Follow up visit  no  Do you have questions or concerns about your Care? no  Actions: * If pain score is 4 or above: No action needed, pain <4.

## 2012-04-21 ENCOUNTER — Encounter: Payer: Self-pay | Admitting: Gastroenterology

## 2012-05-06 ENCOUNTER — Telehealth: Payer: Self-pay | Admitting: Gastroenterology

## 2012-05-06 NOTE — Telephone Encounter (Signed)
No. Probably from anorectal irritation from the large BM. Increased fiber and water everyday

## 2012-05-06 NOTE — Telephone Encounter (Signed)
Patient reports bright red blood with "a larger BM", she denies pain. Blood is not daily, but she reports at least one time a week.   She is wondering where you think the continued bleeding is coming from?  Is the bleeding from the erosions in the ascending colon seen at the time of the colonoscopy ?   She wants to know are there any measures she can take to prevent this from recurring?

## 2012-05-06 NOTE — Telephone Encounter (Signed)
Patient advised of Dr. Ardell Isaacs response.  She will call back for any additional questions or concerns

## 2012-06-18 ENCOUNTER — Other Ambulatory Visit (INDEPENDENT_AMBULATORY_CARE_PROVIDER_SITE_OTHER): Payer: BC Managed Care – PPO

## 2012-06-18 DIAGNOSIS — Z0000 Encounter for general adult medical examination without abnormal findings: Secondary | ICD-10-CM

## 2012-06-19 LAB — COMPREHENSIVE METABOLIC PANEL
AST: 22 U/L (ref 0–37)
Albumin: 4 g/dL (ref 3.5–5.2)
BUN: 13 mg/dL (ref 6–23)
CO2: 24 mEq/L (ref 19–32)
Calcium: 9 mg/dL (ref 8.4–10.5)
Chloride: 107 mEq/L (ref 96–112)
Creatinine, Ser: 0.7 mg/dL (ref 0.4–1.2)
GFR: 96.17 mL/min (ref >60.00)
Potassium: 4.1 mEq/L (ref 3.5–5.1)

## 2012-06-19 LAB — CBC WITH DIFFERENTIAL/PLATELET
Eosinophils Absolute: 0.1 10*3/uL (ref 0.0–0.7)
Eosinophils Relative: 1.4 % (ref 0.0–5.0)
Lymphocytes Relative: 37.8 % (ref 12.0–46.0)
MCHC: 34.6 g/dL (ref 30.0–36.0)
MCV: 94.2 fl (ref 78.0–100.0)
Monocytes Absolute: 0.3 10*3/uL (ref 0.1–1.0)
Neutrophils Relative %: 53.3 % (ref 43.0–77.0)
Platelets: 174 10*3/uL (ref 150.0–400.0)
RBC: 4.37 Mil/uL (ref 3.87–5.11)
WBC: 5.1 10*3/uL (ref 4.5–10.5)

## 2012-06-19 LAB — LIPID PANEL
Cholesterol: 175 mg/dL (ref 0–200)
LDL Cholesterol: 115 mg/dL — ABNORMAL HIGH (ref 0–99)
Triglycerides: 64 mg/dL (ref 0.0–149.0)

## 2012-06-20 ENCOUNTER — Encounter: Payer: Self-pay | Admitting: *Deleted

## 2012-06-26 NOTE — Telephone Encounter (Signed)
Answered in another note

## 2012-06-27 ENCOUNTER — Ambulatory Visit: Payer: BC Managed Care – PPO | Admitting: Internal Medicine

## 2012-07-02 ENCOUNTER — Ambulatory Visit: Payer: BC Managed Care – PPO | Admitting: Internal Medicine

## 2012-07-03 ENCOUNTER — Other Ambulatory Visit: Payer: BC Managed Care – PPO

## 2013-04-01 ENCOUNTER — Other Ambulatory Visit: Payer: Self-pay

## 2013-04-01 DIAGNOSIS — Z1231 Encounter for screening mammogram for malignant neoplasm of breast: Secondary | ICD-10-CM

## 2013-06-06 ENCOUNTER — Telehealth: Payer: Self-pay

## 2013-06-06 NOTE — Telephone Encounter (Signed)
Left message for call back  identifiable  [facility]--03/2012--polyps--q 5--family hx--next 2019

## 2013-06-09 ENCOUNTER — Ambulatory Visit (INDEPENDENT_AMBULATORY_CARE_PROVIDER_SITE_OTHER): Payer: BC Managed Care – PPO | Admitting: Internal Medicine

## 2013-06-09 ENCOUNTER — Encounter: Payer: Self-pay | Admitting: Internal Medicine

## 2013-06-09 VITALS — BP 93/61 | HR 84 | Temp 98.7°F | Ht 67.5 in

## 2013-06-09 DIAGNOSIS — E049 Nontoxic goiter, unspecified: Secondary | ICD-10-CM

## 2013-06-09 DIAGNOSIS — A6000 Herpesviral infection of urogenital system, unspecified: Secondary | ICD-10-CM

## 2013-06-09 DIAGNOSIS — Z0000 Encounter for general adult medical examination without abnormal findings: Secondary | ICD-10-CM

## 2013-06-09 MED ORDER — VALTREX 500 MG PO TABS: ORAL_TABLET | ORAL | Status: DC

## 2013-06-09 NOTE — Assessment & Plan Note (Addendum)
Td-- 2009 Sees gyn, plans to see him this summer  GF had colon ca age ~ 65, her father is okay [facility]--03/2012--polyps--q 5--family hx--next 2019 Labs Diet and exercise -- improving! myfitness pal? Enlarged thyroid ? -----check a u/s

## 2013-06-09 NOTE — Telephone Encounter (Signed)
Unable to reach prior to visit

## 2013-06-09 NOTE — Progress Notes (Signed)
Subjective:    Patient ID: Cathy Galvan, female    DOB: [birth year], 43 y.o.   MRN: [id number]  DOS:  06/09/2013 Type of  visit:  CPX  ROS Diet, Exercise-- improving! Lost 10 pounds   No  CP, SOB No palpitations, no lower extremity edema Denies  nausea, vomiting diarrhea Denies  blood in the stools Still occ GERD, on Tums prn no dysphagia or odynophagia (-) cough, sputum production No dysuria, gross hematuria, difficulty urinating  No anxiety, depression Denies suicidal ideas      Past Medical History  Diagnosis Date  . Hypoglycemia   . Asthma     adult onset ; no childhood astma  . GERD (gastroesophageal reflux disease)     negative cardiac workup for chest pain 03/12  . MVP (mitral valve prolapse) 1973    as per ECHO ; not found on ECHO 03/12  . Herpes genitalis   . Migraine     rarely  . Allergy   . Anemia   . Anxiety     ONLY WITH LOW BLOOD SUGAR    Past Surgical History  Procedure Laterality Date  . Eye surgery      DUANE syndrome-Left eye; decreased meial &lateral rotation OS  . Fracture surgery      Left Arm   . Urethra surgery      dilation     History   Social History  . Marital Status: Married    Spouse Name: N/A    Number of Children: 0  . Years of Education: N/A   Occupational History  . 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 Smoking status: Former Smoker    Quit date: 01/30/1993  . Smokeless tobacco: Never Used     Comment: 17 years ago as of 2012  . Alcohol Use: No  . Drug Use: No  . Sexual Activity: Not on file   Other Topics Concern  . Not on file   Social History Narrative   Lives w/ husband      Family History  Problem Relation Age of Onset  . Diabetes Father   . Obesity Father   . Hypertension Father   . Hyperlipidemia Father   . Colon polyps Father   . Heart disease Maternal Grandfather   . Dementia Maternal Grandfather   . Colon cancer Paternal Grandfather     GF dx 66s  . Thyroid  disease Paternal Aunt   . Colon polyps Paternal Aunt   . Lung cancer Maternal Grandmother     smoker 40 + years   . Breast cancer Paternal Aunt   . Colon cancer Maternal Uncle   . Kidney disease Mother        Medication List       This list is accurate as of: 06/09/13  7:09 PM.  Always use your most recent med list.               AMBULATORY NON FORMULARY MEDICATION  - Allergy Injections  -      Cinnamon 500 MG capsule  Take 1,000 mg by mouth daily.     loratadine 10 MG tablet  Commonly known as:  CLARITIN  Take 10 mg by mouth daily.     multivitamin capsule  Take 1 capsule by mouth daily.     NASONEX 50 MCG/ACT nasal spray  Generic drug:  mometasone  Place 1 spray into the nose as needed.     VALTREX 500  MG tablet  Generic drug:  valACYclovir  One tablet twice a day for 3 days for each outbreak.Brand medically neccesary     vitamin C 500 MG tablet  Commonly known as:  ASCORBIC ACID  Take 500 mg by mouth daily.     vitamin E 200 UNIT capsule  Take 200 Units by mouth 2 (two) times daily.           Objective:   Physical Exam BP 93/61  Pulse 84  Temp(Src) 98.7 F (37.1 C) (Oral)  Ht 5' 7.5" (1.715 m)  SpO2 97%  LMP 05/14/2013  General -- alert, well-developed, NAD.  Neck --? Enlarged thyroid on the L , no LAD HEENT-- Not pale.  Lungs -- normal respiratory effort, no intercostal retractions, no accessory muscle use, and normal breath sounds.  Heart-- normal rate, regular rhythm, no murmur.  Abdomen-- Not distended, good bowel sounds,soft, non-tender.  Extremities-- no pretibial edema bilaterally  Neurologic--  alert & oriented X3. Speech normal, gait normal, strength normal in all extremities.  Psych-- Cognition and judgment appear intact. Cooperative with normal attention span and concentration. No anxious or depressed appearing.       Assessment & Plan:

## 2013-06-09 NOTE — Patient Instructions (Signed)
come back tomorrow fasting: FLP, CMP, TSH, CBC, vitamin D--- Dx V70  Next visit one year

## 2013-06-09 NOTE — Assessment & Plan Note (Signed)
Has been experiencing infrequent outbrakes. Request branded Valtrex, prescription provided

## 2013-06-09 NOTE — Progress Notes (Signed)
Pre visit review using our clinic review tool, if applicable. No additional management support is needed unless otherwise documented below in the visit note. 

## 2013-06-10 ENCOUNTER — Telehealth: Payer: Self-pay | Admitting: Internal Medicine

## 2013-06-10 ENCOUNTER — Ambulatory Visit (HOSPITAL_BASED_OUTPATIENT_CLINIC_OR_DEPARTMENT_OTHER)
Admission: RE | Admit: 2013-06-10 | Discharge: 2013-06-10 | Disposition: A | Discharge status: Home / Self Care | Payer: BC Managed Care – PPO | Source: Ambulatory Visit | Attending: Internal Medicine | Admitting: Internal Medicine

## 2013-06-10 ENCOUNTER — Other Ambulatory Visit (INDEPENDENT_AMBULATORY_CARE_PROVIDER_SITE_OTHER): Payer: BC Managed Care – PPO

## 2013-06-10 DIAGNOSIS — E049 Nontoxic goiter, unspecified: Secondary | ICD-10-CM

## 2013-06-10 DIAGNOSIS — Z0000 Encounter for general adult medical examination without abnormal findings: Secondary | ICD-10-CM

## 2013-06-10 DIAGNOSIS — E162 Hypoglycemia, unspecified: Secondary | ICD-10-CM

## 2013-06-10 LAB — CBC WITH DIFFERENTIAL/PLATELET
Basophils Absolute: 0 10*3/uL (ref 0.0–0.1)
Basophils Relative: 0.4 % (ref 0.0–3.0)
EOS PCT: 1.3 % (ref 0.0–5.0)
Eosinophils Absolute: 0 10*3/uL (ref 0.0–0.7)
HEMATOCRIT: 38 % (ref 36.0–46.0)
HEMOGLOBIN: 13 g/dL (ref 12.0–15.0)
LYMPHS ABS: 1.3 10*3/uL (ref 0.7–4.0)
LYMPHS PCT: 34.5 % (ref 12.0–46.0)
MCHC: 34.2 g/dL (ref 30.0–36.0)
MCV: 93.2 fl (ref 78.0–100.0)
Monocytes Absolute: 0.3 10*3/uL (ref 0.1–1.0)
Monocytes Relative: 8.9 % (ref 3.0–12.0)
NEUTROS ABS: 2.1 10*3/uL (ref 1.4–7.7)
Neutrophils Relative %: 54.9 % (ref 43.0–77.0)
Platelets: 179 10*3/uL (ref 150.0–400.0)
RBC: 4.08 Mil/uL (ref 3.87–5.11)
RDW: 12.8 % (ref 11.5–15.5)
WBC: 3.8 10*3/uL — AB (ref 4.0–10.5)

## 2013-06-10 LAB — COMPREHENSIVE METABOLIC PANEL
ALK PHOS: 42 U/L (ref 39–117)
ALT: 14 U/L (ref 0–35)
AST: 19 U/L (ref 0–37)
Albumin: 3.9 g/dL (ref 3.5–5.2)
BILIRUBIN TOTAL: 0.7 mg/dL (ref 0.2–1.2)
BUN: 14 mg/dL (ref 6–23)
CO2: 27 mEq/L (ref 19–32)
Calcium: 8.9 mg/dL (ref 8.4–10.5)
Chloride: 103 mEq/L (ref 96–112)
Creatinine, Ser: 0.7 mg/dL (ref 0.4–1.2)
GFR: 104.13 mL/min (ref >60.00)
GLUCOSE: 65 mg/dL — AB (ref 70–99)
Potassium: 3.8 mEq/L (ref 3.5–5.1)
Sodium: 136 mEq/L (ref 135–145)
Total Protein: 6.5 g/dL (ref 6.0–8.3)

## 2013-06-10 LAB — LIPID PANEL
CHOLESTEROL: 180 mg/dL (ref 0–200)
HDL: 56.6 mg/dL (ref >39.00)
LDL Cholesterol: 118 mg/dL — ABNORMAL HIGH (ref 0–99)
TRIGLYCERIDES: 28 mg/dL (ref 0.0–149.0)
Total CHOL/HDL Ratio: 3
VLDL: 5.6 mg/dL (ref 0.0–40.0)

## 2013-06-10 LAB — HOMOCYSTEINE: Homocysteine: 8.3 umol/L (ref 4.0–15.4)

## 2013-06-10 LAB — TSH: TSH: 1.91 u[IU]/mL (ref 0.35–4.50)

## 2013-06-10 NOTE — Telephone Encounter (Signed)
LM for patient of recommendations and to CB with her decision.

## 2013-06-10 NOTE — Addendum Note (Signed)
Addended by: Modena Morrow D on: 06/10/2013 03:59 PM   Modules accepted: Orders

## 2013-06-10 NOTE — Telephone Encounter (Signed)
Pt had labs done this morning.  Pt wants to make sure the following are added/included in her labwork: Fasting blood insulin level, Fasting blood glucose, hemoglobin A1C, Fructosamine, Homocysteine, Vitamin D, C-reactive protein and Gluten sensitivity test w/Cyrex array 3 test.  Pt sated that the gluten test may have to be done at another lab?  Please advise.

## 2013-06-10 NOTE — Telephone Encounter (Signed)
Do you want to order any of these additional labs?  Please advise

## 2013-06-10 NOTE — Telephone Encounter (Signed)
Okay to add: A1c, insulin levels,C reactive protein, homocysteine level, fructosamine.please be sure the patient is  aware her insurance may not like to pay for the test. I don't recommend gluten sensitivity testing

## 2013-06-10 NOTE — Telephone Encounter (Signed)
Pt called back.  Please call back at [phone]

## 2013-06-10 NOTE — Telephone Encounter (Addendum)
Spoke with patient who states that she would like to proceed with the Creactive protein, homocysteine level, fructosamine. Notified that insurance will more than likely not cover the test.  Patient request a call when we have the results of the Thyroid US. She is going out of town on Thursday and has no cell phone.   Orders entered by lab

## 2013-06-10 NOTE — Addendum Note (Signed)
Addended by: Modena Morrow D on: 06/10/2013 04:02 PM   Modules accepted: Orders

## 2013-06-11 ENCOUNTER — Other Ambulatory Visit: Payer: Self-pay | Admitting: Internal Medicine

## 2013-06-11 ENCOUNTER — Ambulatory Visit: Payer: BC Managed Care – PPO

## 2013-06-11 ENCOUNTER — Telehealth: Payer: Self-pay

## 2013-06-11 DIAGNOSIS — E041 Nontoxic single thyroid nodule: Secondary | ICD-10-CM

## 2013-06-11 DIAGNOSIS — Z0000 Encounter for general adult medical examination without abnormal findings: Secondary | ICD-10-CM

## 2013-06-11 LAB — C-REACTIVE PROTEIN: CRP: <0.5 mg/dL (ref 0.5–20.0)

## 2013-06-11 NOTE — Telephone Encounter (Signed)
Message copied by Reino Bellis on Wed Jun 11, 2013  9:55 AM ------      Message from: Kathlene November E      Created: Wed Jun 11, 2013  8:34 AM       Advise pt: u/s confirmed a nodule at the L thyroid, next step is to get a sample (biopsy)  Which I ordered ------

## 2013-06-11 NOTE — Telephone Encounter (Signed)
Advised patient per results. Answered questions according to protocol.  Patient verbalizes understanding.

## 2013-06-13 LAB — FRUCTOSAMINE: Fructosamine: 237 umol/L (ref 190–270)

## 2013-06-15 LAB — VITAMIN D 1,25 DIHYDROXY
Vitamin D 1, 25 (OH)2 Total: 46 pg/mL (ref 18–72)
Vitamin D3 1, 25 (OH)2: 46 pg/mL

## 2013-06-16 ENCOUNTER — Encounter: Payer: Self-pay | Admitting: *Deleted

## 2013-06-16 NOTE — Progress Notes (Signed)
Letter sent with lab results.

## 2013-06-17 ENCOUNTER — Ambulatory Visit: Payer: BC Managed Care – PPO

## 2013-06-17 ENCOUNTER — Ambulatory Visit
Admission: RE | Admit: 2013-06-17 | Discharge: 2013-06-17 | Disposition: A | Discharge status: Home / Self Care | Payer: BC Managed Care – PPO | Source: Ambulatory Visit | Attending: Internal Medicine | Admitting: Internal Medicine

## 2013-06-17 ENCOUNTER — Other Ambulatory Visit (HOSPITAL_COMMUNITY)
Admission: RE | Admit: 2013-06-17 | Discharge: 2013-06-17 | Disposition: A | Discharge status: Home / Self Care | Payer: BC Managed Care – PPO | Source: Ambulatory Visit | Attending: Interventional Radiology | Admitting: Interventional Radiology

## 2013-06-17 DIAGNOSIS — E041 Nontoxic single thyroid nodule: Secondary | ICD-10-CM

## 2013-06-17 DIAGNOSIS — K219 Gastro-esophageal reflux disease without esophagitis: Secondary | ICD-10-CM | POA: Insufficient documentation

## 2013-06-17 DIAGNOSIS — A6000 Herpesviral infection of urogenital system, unspecified: Secondary | ICD-10-CM | POA: Insufficient documentation

## 2013-06-18 ENCOUNTER — Ambulatory Visit (HOSPITAL_BASED_OUTPATIENT_CLINIC_OR_DEPARTMENT_OTHER): Payer: BC Managed Care – PPO

## 2013-06-18 ENCOUNTER — Telehealth: Payer: Self-pay | Admitting: Internal Medicine

## 2013-06-18 ENCOUNTER — Ambulatory Visit: Payer: BC Managed Care – PPO

## 2013-06-18 NOTE — Telephone Encounter (Signed)
A user error has taken place.

## 2013-06-19 ENCOUNTER — Other Ambulatory Visit: Payer: Self-pay | Admitting: Internal Medicine

## 2013-06-19 DIAGNOSIS — E041 Nontoxic single thyroid nodule: Secondary | ICD-10-CM

## 2013-06-24 ENCOUNTER — Telehealth: Payer: Self-pay | Admitting: Internal Medicine

## 2013-06-24 DIAGNOSIS — E041 Nontoxic single thyroid nodule: Secondary | ICD-10-CM

## 2013-06-24 NOTE — Telephone Encounter (Signed)
Patient called requesting a copy of her biopsy results and pathology results be sent to her home address.

## 2013-06-24 NOTE — Telephone Encounter (Signed)
Handled by Shanon Brow

## 2013-06-24 NOTE — Telephone Encounter (Signed)
Patient is requesting 2nd opinion to [facility] for her thyroid. Can you enter new referral for ENT please?

## 2013-06-24 NOTE — Telephone Encounter (Signed)
Done . Mailed results.

## 2013-07-08 ENCOUNTER — Telehealth: Payer: Self-pay | Admitting: Emergency Medicine

## 2013-07-08 NOTE — Telephone Encounter (Signed)
Pt called w/ concern of a dull ache/constant since she has had the Thyroid Bx, which was 3 weeks ago.  She is a 2 on the pain scale.  Not really painful, however, the ache is constant.  Just concerned as to why she is having this 3wks post Bx.  Pt stated that she had not been taking any meds to help with the irritation.   S/W Dr Kathlene Cote, he stated that she may be having more irritation than usual.  This should get better over time.   Pt is scheduled to see the surgeon on Friday.  She will have him look at the area, if it is still problematic, then she will call us for an appt. To see one of our Interventional radiologists.  I offered the pt to come in the office tomorrow to see on of our IR Docs.  She will wait until after she sees the surgeon.   Janith Lima, EMT 07/08/2013 4:09 PM

## 2013-07-18 ENCOUNTER — Encounter (INDEPENDENT_AMBULATORY_CARE_PROVIDER_SITE_OTHER): Payer: Self-pay | Admitting: Surgery

## 2013-07-18 ENCOUNTER — Ambulatory Visit (INDEPENDENT_AMBULATORY_CARE_PROVIDER_SITE_OTHER): Payer: BC Managed Care – PPO | Admitting: Surgery

## 2013-07-18 VITALS — BP 105/75 | HR 73 | Temp 97.8°F | Resp 14 | Ht 68.0 in | Wt 217.0 lb

## 2013-07-18 DIAGNOSIS — D440 Neoplasm of uncertain behavior of thyroid gland: Secondary | ICD-10-CM | POA: Insufficient documentation

## 2013-07-18 DIAGNOSIS — D449 Neoplasm of uncertain behavior of unspecified endocrine gland: Secondary | ICD-10-CM

## 2013-07-18 NOTE — Progress Notes (Signed)
General Surgery [facility]  Chief Complaint  Patient presents with  . New Evaluation    thyroid nodule with atypia - referral from Dr. Kathlene November    HISTORY: Patient is a 43 year old female referred by her primary care physician for nasally diagnosed thyroid nodule. This had been noted on physical examination by the physician in May 2015. Subsequent thyroid ultrasound showed a solitary 18 mm solid nodule in the lower pole of the left thyroid lobe. Fine needle aspiration biopsy was obtained. This showed a follicular lesion which was hypercellular. 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 There was a minimum of background colloid. Patient is now referred to surgery for consideration for excision for definitive diagnosis. TSH level is normal at 1.91.  Patient has no prior history of thyroid disease. She has never been on thyroid medication. There is no family history of thyroid disease other than a paternal aunt who has hypothyroidism. There is no family history of endocrine neoplasms.  Patient has no prior history of head or neck surgery. She has never been on thyroid hormone.  Past Medical History  Diagnosis Date  . Hypoglycemia   . Asthma     adult onset ; no childhood astma  . GERD (gastroesophageal reflux disease)     negative cardiac workup for chest pain 03/12  . MVP (mitral valve prolapse) 1973    as per ECHO ; not found on ECHO 03/12  . Herpes genitalis   . Migraine     rarely  . Allergy   . Anemia   . Anxiety     ONLY WITH LOW BLOOD SUGAR  . Heart murmur     Current Outpatient Prescriptions  Medication Sig Dispense Refill  . AMBULATORY NON FORMULARY MEDICATION Allergy Injections        . Cinnamon 500 MG capsule Take 1,000 mg by mouth daily.      Marland Kitchen loratadine (CLARITIN) 10 MG tablet Take 10 mg by mouth daily.      Marland Kitchen Lysine HCl (L-FORMULA LYSINE HCL) 500 MG TABS Take 2,000 mg by mouth as needed.      . Multiple Vitamin (MULTIVITAMIN)  capsule Take 1 capsule by mouth daily.      Marland Kitchen NASONEX 50 MCG/ACT nasal spray Place 1 spray into the nose as needed.      Marland Kitchen VALTREX 500 MG tablet One tablet twice a day for 3 days for each outbreak.Brand medically neccesary  30 tablet  1  . vitamin C (ASCORBIC ACID) 500 MG tablet Take 500 mg by mouth daily.      . vitamin E 200 UNIT capsule Take 200 Units by mouth 2 (two) times daily.       No current facility-administered medications for this visit.    Allergies  Allergen Reactions  . Prilosec Otc [Omeprazole Magnesium]     Rash ( Note: she had poison ivy @ time also)  . Zantac [Ranitidine Hcl]     headache    Family History  Problem Relation Age of Onset  . Diabetes Father   . Obesity Father   . Hypertension Father   . Hyperlipidemia Father   . Colon polyps Father   . Heart disease Maternal Grandfather   . Dementia Maternal Grandfather   . Colon cancer Paternal Grandfather     GF dx 4s  . Thyroid disease Paternal Aunt   . Colon polyps Paternal Aunt   . Lung cancer Maternal Grandmother     smoker  40 + years   . Breast cancer Paternal Aunt   . Colon cancer Maternal Uncle   . Kidney disease Mother     History   Social History  . Marital Status: Married    Spouse Name: N/A    Number of Children: 0  . Years of Education: N/A   Occupational History  . 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 Smoking status: Former Smoker    Quit date: 01/30/1993  . Smokeless tobacco: Never Used     Comment: 17 years ago as of 2012  . Alcohol Use: No  . Drug Use: No  . Sexual Activity: None   Other Topics Concern  . None   Social History Narrative   Lives w/ husband     REVIEW OF SYSTEMS - PERTINENT POSITIVES ONLY: Denies tremor. Denies palpitation. Denies dysphagia. Denies compressive symptoms.  EXAM: Filed Vitals:   07/18/13 1611  BP: 105/75  Pulse: 73  Temp: 97.8 F (36.6 C)  Resp: 14    GENERAL: well-developed, well-nourished, no acute  distress HEENT: normocephalic; pupils equal and reactive; sclerae clear; dentition good; mucous membranes moist NECK:  Palpable 2 cm nodule in the mid left thyroid lobe, smooth, mobile, nontender, relatively firm; right thyroid lobe without palpable abnormalities; asymmetric on extension; no palpable anterior or posterior cervical lymphadenopathy; no supraclavicular masses; no tenderness CHEST: clear to auscultation bilaterally without rales, rhonchi, or wheezes CARDIAC: regular rate and rhythm without significant murmur; peripheral pulses are full EXT:  non-tender without edema; no deformity NEURO: no gross focal deficits; no sign of tremor   LABORATORY RESULTS: See [facility] (CHL-Epic) for most recent results  RADIOLOGY RESULTS: See [facility] (CHL-Epic) for most recent results  IMPRESSION: Left thyroid nodule, 1.8 cm, with cytologic atypia and Hurthle cell change  PLAN: I had an extensive discussion with the patient and her mother. I provided him with written literature to review at home. We discussed options for management. I have recommended left thyroid lobectomy for definitive diagnosis. In the event of malignancy, I would recommend completion thyroidectomy. I quoted them a risk of malignancy of approximately 20-25%.  We discussed thyroid surgery at length. We discussed the risk and benefits including the risk of injury to recurrent laryngeal nerve and injury to parathyroid glands. We discussed the possible need for lifelong thyroid hormone replacement. We discussed the hospital stay to be anticipated. We discussed the surgical wound and the cosmetic results to be anticipated. They understand and wish to proceed in the near future.  The risks and benefits of the procedure have been discussed at length with the patient.  The patient understands the proposed procedure, potential alternative treatments, and the course of recovery to be expected.  All of the patient's questions  have been answered at this time.  The patient wishes to proceed with surgery.  Earnstine Regal, MD, [facility]  Primary Care Physician: Kathlene November, MD

## 2013-07-18 NOTE — Patient Instructions (Signed)

## 2013-07-30 DIAGNOSIS — E041 Nontoxic single thyroid nodule: Secondary | ICD-10-CM | POA: Insufficient documentation

## 2013-08-16 ENCOUNTER — Telehealth (INDEPENDENT_AMBULATORY_CARE_PROVIDER_SITE_OTHER): Payer: Self-pay | Admitting: Surgery

## 2013-08-16 NOTE — Telephone Encounter (Signed)
Reached patient discussed surgery date and financial responsibility, patient wants to call back to schedule.

## 2014-05-01 DIAGNOSIS — C73 Malignant neoplasm of thyroid gland: Secondary | ICD-10-CM | POA: Insufficient documentation

## 2014-05-01 DIAGNOSIS — E210 Primary hyperparathyroidism: Secondary | ICD-10-CM

## 2014-05-01 HISTORY — DX: Primary hyperparathyroidism: E21.0

## 2014-05-01 HISTORY — PX: OTHER SURGICAL HISTORY: SHX169

## 2014-05-01 HISTORY — DX: Malignant neoplasm of thyroid gland: C73

## 2014-05-01 HISTORY — PX: THYROIDECTOMY, PARTIAL: SHX18

## 2014-05-06 ENCOUNTER — Other Ambulatory Visit: Payer: Self-pay

## 2014-06-09 ENCOUNTER — Telehealth: Payer: Self-pay | Admitting: Internal Medicine

## 2014-06-09 NOTE — Telephone Encounter (Signed)
Pre Visit letter sent  °

## 2014-06-26 ENCOUNTER — Telehealth: Payer: Self-pay | Admitting: Internal Medicine

## 2014-06-26 NOTE — Telephone Encounter (Signed)
Noted  

## 2014-06-26 NOTE — Telephone Encounter (Signed)
Pt wanted to make MD aware she does not want to fast for labs due to her medical condition. Pt wanted her request noted.

## 2014-06-30 ENCOUNTER — Ambulatory Visit (INDEPENDENT_AMBULATORY_CARE_PROVIDER_SITE_OTHER): Payer: BC Managed Care – PPO | Admitting: Internal Medicine

## 2014-06-30 ENCOUNTER — Other Ambulatory Visit: Payer: Self-pay

## 2014-06-30 ENCOUNTER — Encounter: Payer: Self-pay | Admitting: Internal Medicine

## 2014-06-30 VITALS — BP 124/78 | HR 81 | Temp 97.8°F | Ht 68.0 in | Wt 220.0 lb

## 2014-06-30 DIAGNOSIS — C73 Malignant neoplasm of thyroid gland: Secondary | ICD-10-CM

## 2014-06-30 DIAGNOSIS — E210 Primary hyperparathyroidism: Secondary | ICD-10-CM

## 2014-06-30 DIAGNOSIS — Z0000 Encounter for general adult medical examination without abnormal findings: Secondary | ICD-10-CM | POA: Diagnosis not present

## 2014-06-30 LAB — CBC WITH DIFFERENTIAL/PLATELET
BASOS ABS: 0 10*3/uL (ref 0.0–0.1)
Basophils Relative: 0.3 % (ref 0.0–3.0)
Eosinophils Absolute: 0.1 10*3/uL (ref 0.0–0.7)
Eosinophils Relative: 1.1 % (ref 0.0–5.0)
HCT: 38.3 % (ref 36.0–46.0)
Hemoglobin: 13.2 g/dL (ref 12.0–15.0)
LYMPHS PCT: 35.3 % (ref 12.0–46.0)
Lymphs Abs: 2.1 10*3/uL (ref 0.7–4.0)
MCHC: 34.6 g/dL (ref 30.0–36.0)
MCV: 90.5 fl (ref 78.0–100.0)
MONO ABS: 0.3 10*3/uL (ref 0.1–1.0)
MONOS PCT: 5.4 % (ref 3.0–12.0)
NEUTROS PCT: 57.9 % (ref 43.0–77.0)
Neutro Abs: 3.4 10*3/uL (ref 1.4–7.7)
PLATELETS: 231 10*3/uL (ref 150.0–400.0)
RBC: 4.23 Mil/uL (ref 3.87–5.11)
RDW: 13.2 % (ref 11.5–15.5)
WBC: 5.9 10*3/uL (ref 4.0–10.5)

## 2014-06-30 MED ORDER — VALTREX 500 MG PO TABS: ORAL_TABLET | ORAL | Status: DC

## 2014-06-30 NOTE — Patient Instructions (Signed)
Get your blood work before you leave    Start a MyChart account

## 2014-06-30 NOTE — Assessment & Plan Note (Addendum)
Td-- 2009  Sees gyn, no recent visits; encouraged to see gyn and get mammogram within the next few months   GF had colon ca age ~ 56, her father is okay [facility]--03/2012--polyps--q 5--family hx--next 2019  Labs , she request a number of specific labs. See orders  Diet and exercise briefly discussed  Red blood per rectum: Likely a benign distal pathology, see review of systems, up-to-date on colonoscopies

## 2014-06-30 NOTE — Assessment & Plan Note (Signed)
Last year was found to have large thyroid, she consulted with our local surgery department and endocrinology Dr. Elyse Hsu and eventually had surgery at [facility] in April 2016, diagnosed with follicular thyroid carcinoma and primary hyperparathyroidism. At this point she is still under the care of [facility], likes her blood work done today, she will share the results with [facility] through [facility]

## 2014-06-30 NOTE — Progress Notes (Signed)
Subjective:    Patient ID: Cathy Galvan, female    DOB: [birth year], 44 y.o.   MRN: [id number]  DOS:  06/30/2014 Type of visit - description : cpx Interval history: Since the last office visit ago she was diagnosed with thyroid cancer and primary hyperparathyroidism .    Review of Systems Constitutional: No fever. No chills. No unexplained wt changes. No unusual sweats  HEENT: No dental problems, no ear discharge, no facial swelling, no voice changes. No eye discharge, no eye  redness , no  intolerance to light   Respiratory: No wheezing , no  difficulty breathing. No cough , no mucus production  Cardiovascular: No CP, no leg swelling , no  Palpitations  GI: no nausea, no vomiting, no diarrhea , no  abdominal pain.  + blood in the stools, fresh, red, started after she initiated OTC calcium supplements and she got moderately constipated.. No dysphagia, no odynophagia    Endocrine: No polyphagia, no polyuria , no polydipsia  GU: No dysuria, gross hematuria, difficulty urinating. No urinary urgency, no frequency.  Musculoskeletal: No joint swellings or unusual aches or pains  Skin: No change in the color of the skin, palor , no  Rash  Allergic, immunologic: No environmental allergies , no  food allergies  Neurological: No dizziness no  syncope. No headaches. No diplopia, no slurred, no slurred speech, no motor deficits, + paresthesias, since she had her thyroid removed, she was told probably related to hypocalcemia  Hematological: No enlarged lymph nodes, no easy bruising , no unusual bleedings  Psychiatry: No suicidal ideas, no hallucinations, no beavior problems, no confusion.  No unusual/severe anxiety, no depression    Past Medical History  Diagnosis Date  . Hypoglycemia   . Asthma     adult onset ; no childhood astma  . GERD (gastroesophageal reflux disease)     negative cardiac workup for chest pain 03/12  . MVP (mitral valve prolapse) 1973    as per ECHO ; not  found on ECHO 03/12  . Herpes genitalis   . Migraine     rarely  . Allergy   . Anemia   . Anxiety     ONLY WITH LOW BLOOD SUGAR  . Heart murmur   . Thyroid cancer [id number]    follicular   . Primary hyperparathyroidism 05-2014    Past Surgical History  Procedure Laterality Date  . Eye surgery      DUANE syndrome-Left eye; decreased meial &lateral rotation OS  . Fracture surgery      Left Arm   . Urethra surgery      dilation   . Thyroidectomy, partial  [id number]    @ [facility] (follicular thyroid cancer)  . Parahtyroid gland resection  05-2014    History   Social History  . Marital Status: Married    Spouse Name: N/A  . Number of Children: 0  . Years of Education: N/A   Occupational History  . 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 Smoking status: Former Smoker    Quit date: 01/30/1993  . Smokeless tobacco: Never Used     Comment: 17 years ago as of 2012  . Alcohol Use: No  . Drug Use: No  . Sexual Activity: Not on file   Other Topics Concern  . Not on file   Social History Narrative   Lives w/ husband      Family History  Problem Relation Age of Onset  . Diabetes  Father   . Obesity Father   . Hypertension Father   . Hyperlipidemia Father   . Colon polyps Father   . Heart disease Maternal Grandfather   . Dementia Maternal Grandfather   . Colon cancer Paternal Grandfather     GF dx 8s  . Thyroid disease Paternal Aunt   . Colon polyps Paternal Aunt   . Lung cancer Maternal Grandmother     smoker 40 + years   . Breast cancer Paternal Aunt   . Colon cancer Maternal Uncle   . Kidney disease Mother        Medication List       This list is accurate as of: 06/30/14  6:37 PM.  Always use your most recent med list.               AMBULATORY NON FORMULARY MEDICATION  - Allergy Injections  -      CALCIUM PO  Take 5,000 mg by mouth daily.     Cinnamon 500 MG capsule  Take 1,000 mg by mouth daily.     L-FORMULA LYSINE HCL 500 MG  Tabs  Generic drug:  Lysine HCl  Take 2,000 mg by mouth as needed.     loratadine 10 MG tablet  Commonly known as:  CLARITIN  Take 10 mg by mouth daily.     multivitamin capsule  Take 1 capsule by mouth daily.     NASONEX 50 MCG/ACT nasal spray  Generic drug:  mometasone  Place 1 spray into the nose as needed.     VALTREX 500 MG tablet  Generic drug:  valACYclovir  One tablet twice a day for 3 days for each outbreak.Brand medically neccesary           Objective:   Physical Exam BP 124/78 mmHg  Pulse 81  Temp(Src) 97.8 F (36.6 C) (Oral)  Ht 5' 8"  (1.727 m)  Wt 220 lb (99.791 kg)  BMI 33.46 kg/m2  SpO2 97%  LMP 06/16/2014 (Approximate) General:   Well developed, well nourished . NAD.  Neck:  Full range of motion. Supple. Well-healed surgical scar  HEENT:  Normocephalic . Face symmetric, atraumatic Lungs:  CTA B Normal respiratory effort, no intercostal retractions, no accessory muscle use. Heart: RRR,  no murmur.  No pretibial edema bilaterally  Abdomen:  Not distended, soft, non-tender. No rebound or rigidity. No mass,organomegaly Skin: Exposed areas without rash. Not pale. Not jaundice Neurologic:  alert & oriented X3.  Speech normal, gait appropriate for age and unassisted Strength symmetric and appropriate for age.  Psych: Cognition and judgment appear intact.  Cooperative with normal attention span and concentration.  Behavior appropriate. No anxious or depressed appearing.       Assessment & Plan:

## 2014-06-30 NOTE — Progress Notes (Signed)
Pre visit review using our clinic review tool, if applicable. No additional management support is needed unless otherwise documented below in the visit note. 

## 2014-07-01 LAB — COMPREHENSIVE METABOLIC PANEL
ALT: 16 U/L (ref 0–35)
AST: 21 U/L (ref 0–37)
Albumin: 4.3 g/dL (ref 3.5–5.2)
Alkaline Phosphatase: 59 U/L (ref 39–117)
BUN: 11 mg/dL (ref 6–23)
CO2: 27 mEq/L (ref 19–32)
Calcium: 9.2 mg/dL (ref 8.4–10.5)
Chloride: 104 mEq/L (ref 96–112)
Creatinine, Ser: 0.93 mg/dL (ref 0.40–1.20)
GFR: 69.75 mL/min (ref >60.00)
Glucose, Bld: 81 mg/dL (ref 70–99)
Potassium: 3.7 mEq/L (ref 3.5–5.1)
Sodium: 136 mEq/L (ref 135–145)
Total Bilirubin: 0.3 mg/dL (ref 0.2–1.2)
Total Protein: 7.1 g/dL (ref 6.0–8.3)

## 2014-07-01 LAB — HIGH SENSITIVITY CRP: CRP HIGH SENSITIVITY: 0.82 mg/L (ref 0.000–5.000)

## 2014-07-01 LAB — TSH: TSH: 3.35 u[IU]/mL (ref 0.35–4.50)

## 2014-07-01 LAB — T3, FREE: T3, Free: 3.1 pg/mL (ref 2.3–4.2)

## 2014-07-01 LAB — T4, FREE: Free T4: 0.62 ng/dL (ref 0.60–1.60)

## 2014-07-01 LAB — PARATHYROID HORMONE, INTACT (NO CA): PTH: 49 pg/mL (ref 14–64)

## 2014-07-01 LAB — HOMOCYSTEINE: Homocysteine: 10.2 umol/L (ref 4.0–15.4)

## 2014-07-01 LAB — CALCIUM, IONIZED: Calcium, Ion: 1.25 mmol/L (ref 1.12–1.32)

## 2014-07-02 LAB — FRUCTOSAMINE: Fructosamine: 220 umol/L (ref 190–270)

## 2014-07-31 ENCOUNTER — Other Ambulatory Visit: Payer: Self-pay

## 2014-08-04 LAB — CYTOLOGY - PAP

## 2014-08-04 IMAGING — US US SOFT TISSUE HEAD/NECK
1 series · 14 of 25 positions shown · non-contrast
Comparison: None.

CLINICAL DATA: Left thyromegaly

EXAM:
THYROID ULTRASOUND
TECHNIQUE: Ultrasound examination of the thyroid gland and adjacent soft
tissues was performed.

[Series 1: us soft tissue head/neck · 0.08mm/px · 14 of 26 slices shown]
[im 1/26]
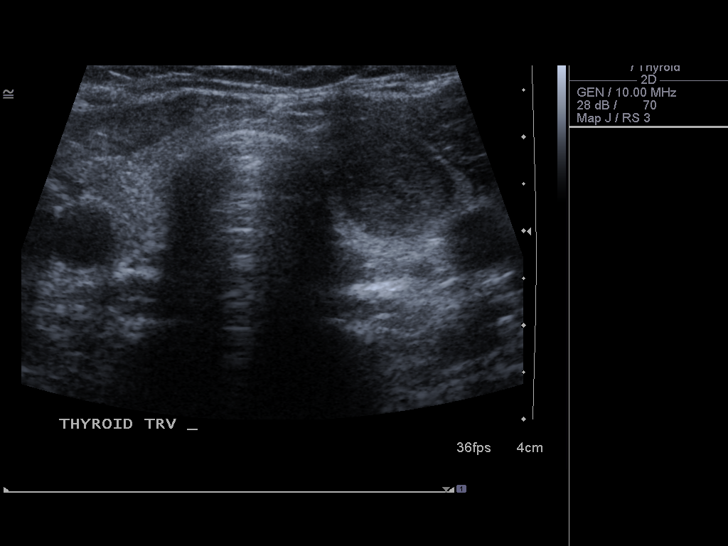
[im 3/26]
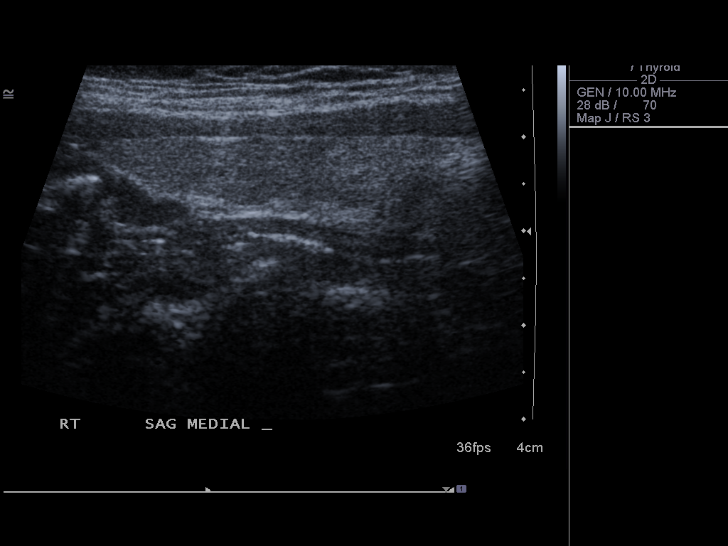
[im 5/26]
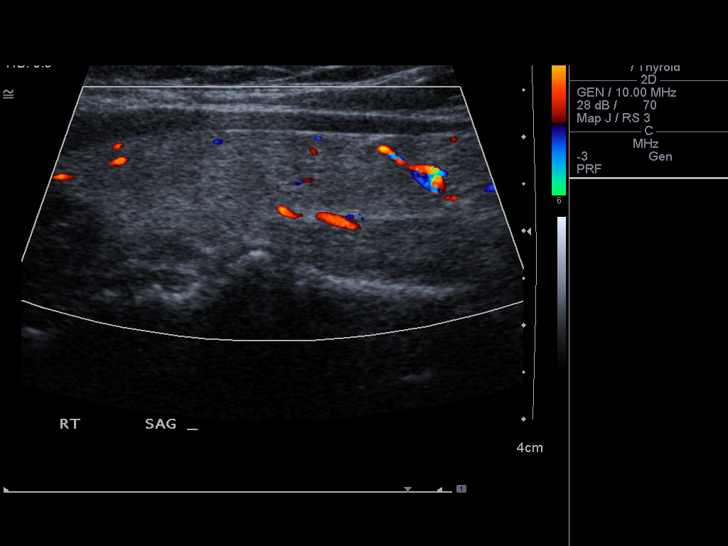
[im 7/26]
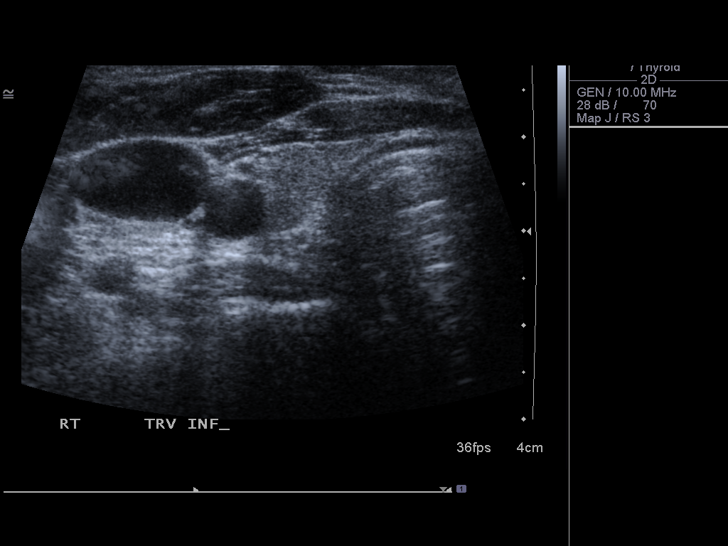
[im 9/26]
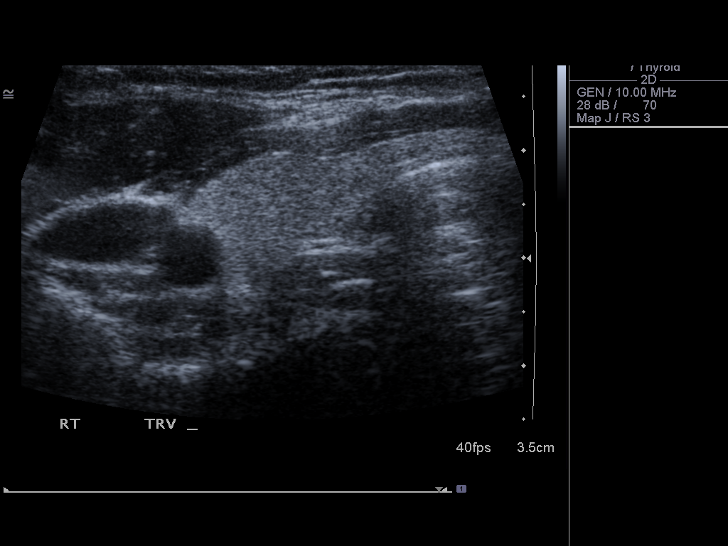
[im 10/26]
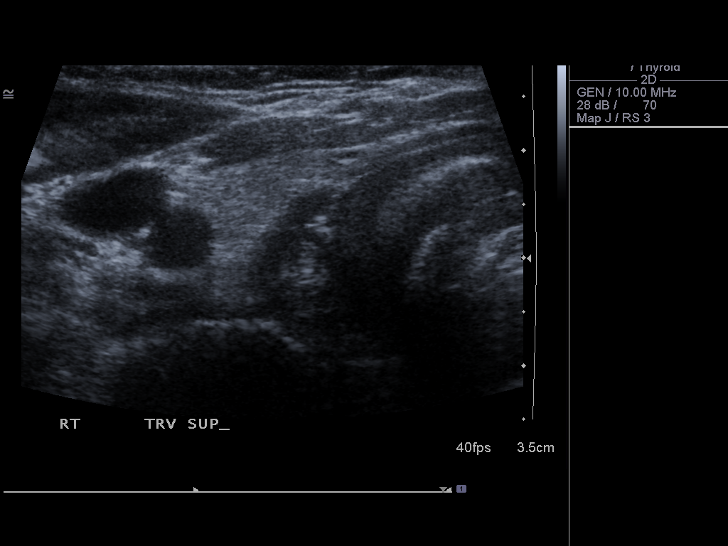
[im 12/26]
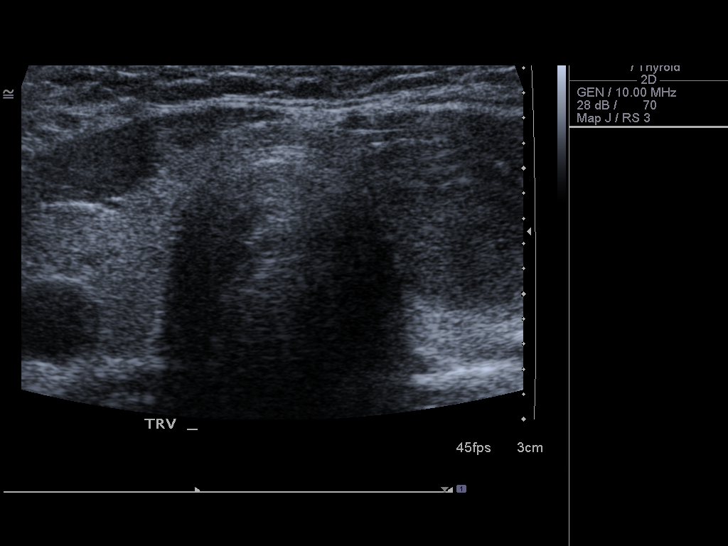
[im 14/26]
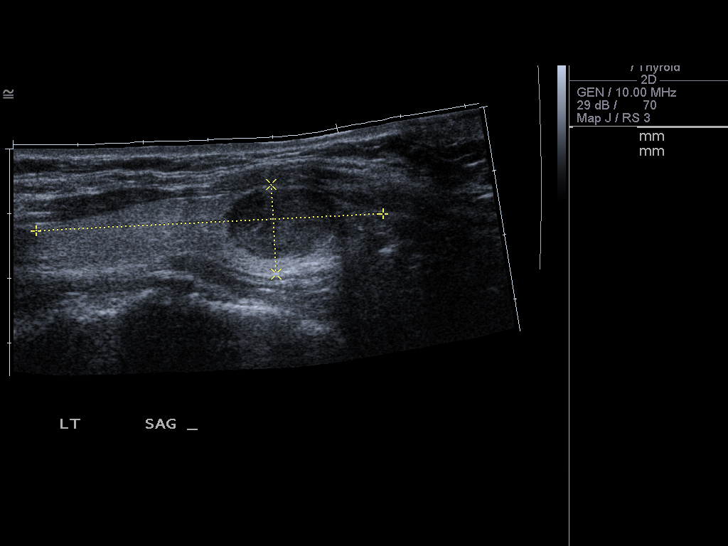
[im 16/26]
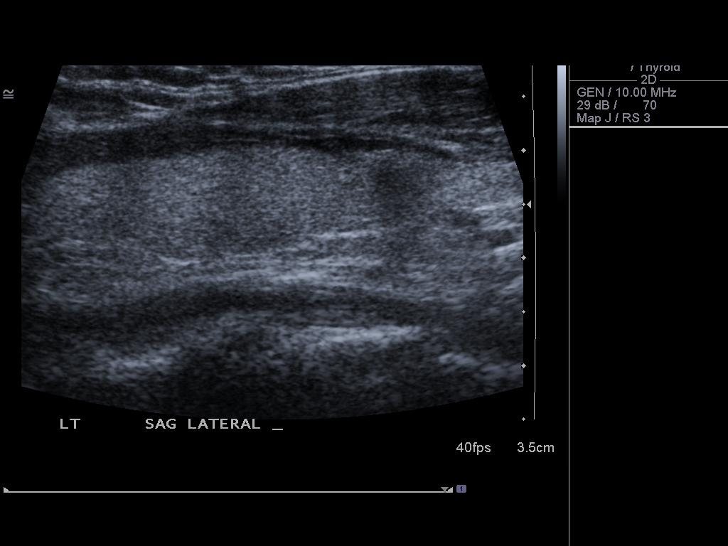
[im 17/26]
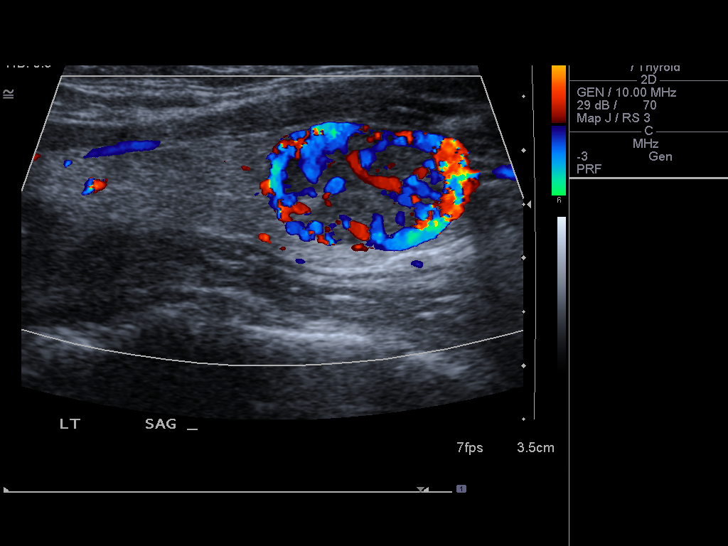
[im 19/26]
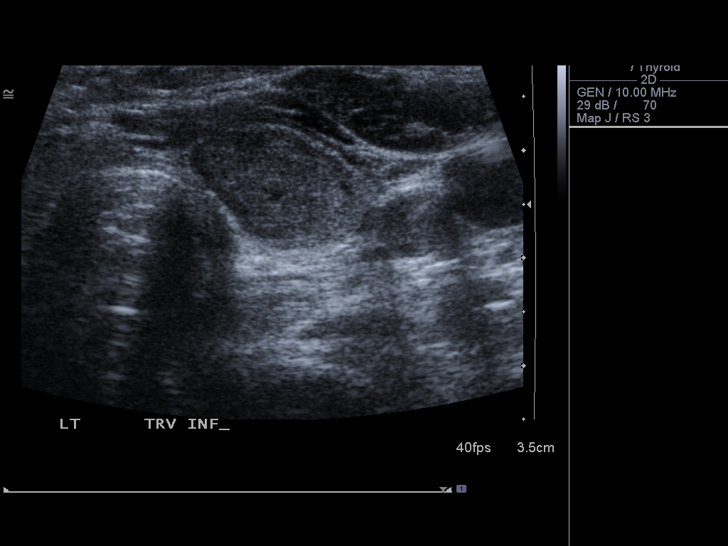
[im 21/26]
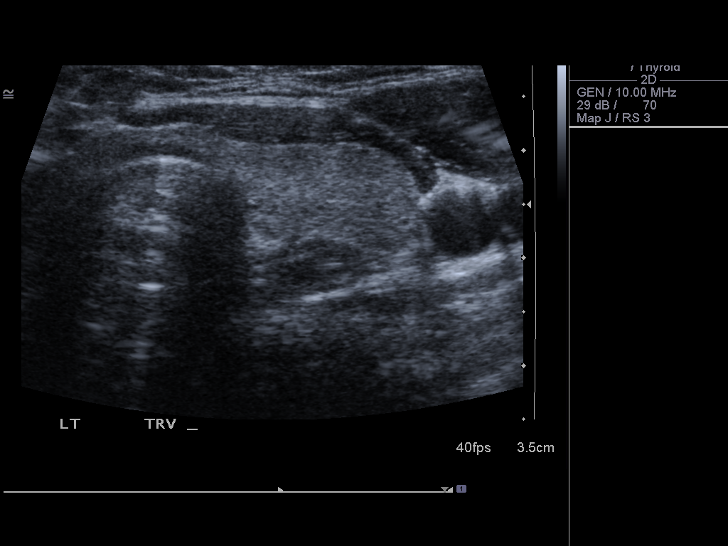
[im 23/26]
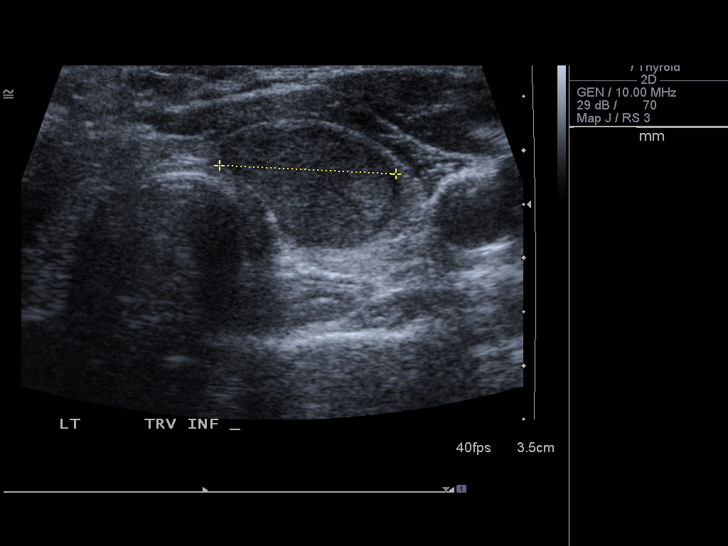
[im 26/26]
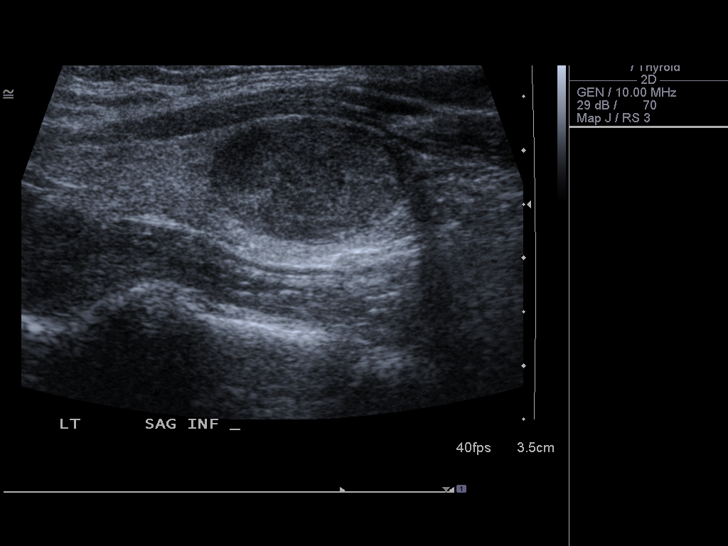

[14 of 25 positions shown; findings below may reference images not displayed]

FINDINGS: Right thyroid lobe

Measurements: 48 x 11 x 17 mm.  No nodules visualized.

Left thyroid lobe

Measurements: 54 x 14 x 16 mm. There is a solitary 18 x 16 x 12 mm
solid nodule in the lower pole.

Isthmus

Thickness: 2 mm.  No nodules visualized.

Lymphadenopathy

None visualized.
IMPRESSION: 1. Solitary left thyroid nodule. Findings meet consensus criteria
for biopsy. Ultrasound-guided fine needle aspiration should be
considered, as per the consensus statement: Management of Thyroid
Nodules Detected at US: Society of Radiologists in Ultrasound

## 2014-08-11 IMAGING — US US THYROID BIOPSY
1 series · 14 of 15 positions shown · non-contrast
Comparison: none

CLINICAL DATA: Solitary left thyroid nodule

EXAM:
ULTRASOUND-GUIDED THYROID ASPIRATION BIOPSY
TECHNIQUE: The procedure, risks (including but not limited to bleeding,
infection, organ damage ), benefits, and alternatives were explained
to the patient. Questions regarding the procedure were encouraged
and answered. The patient understands and consents to the procedure.

[Series 1: us thyroid biopsy · 0.06mm/px · 15 acquisitions, 14 frames shown]
[im 1/15]
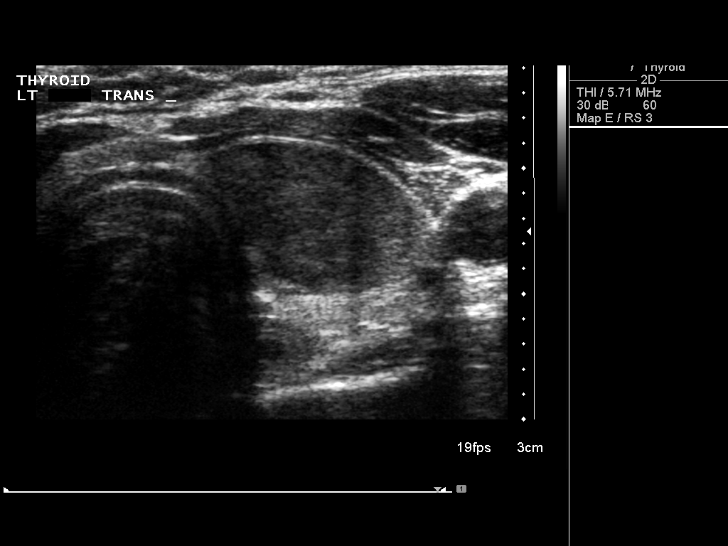
[im 2/15]
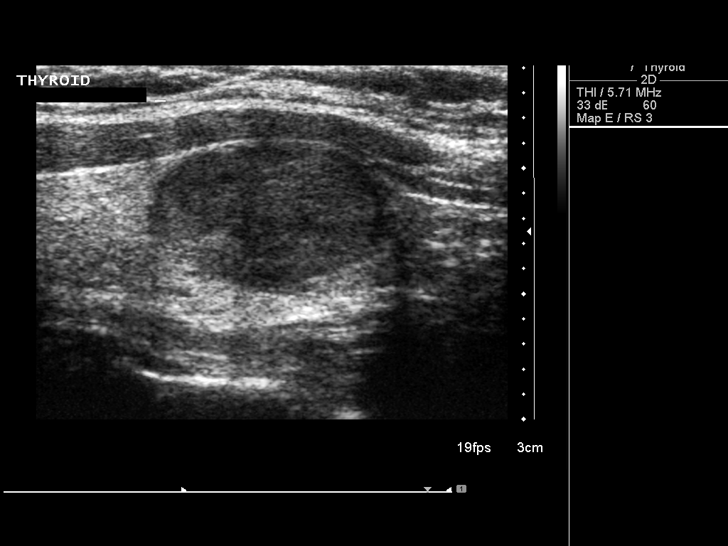
[im 3/15]
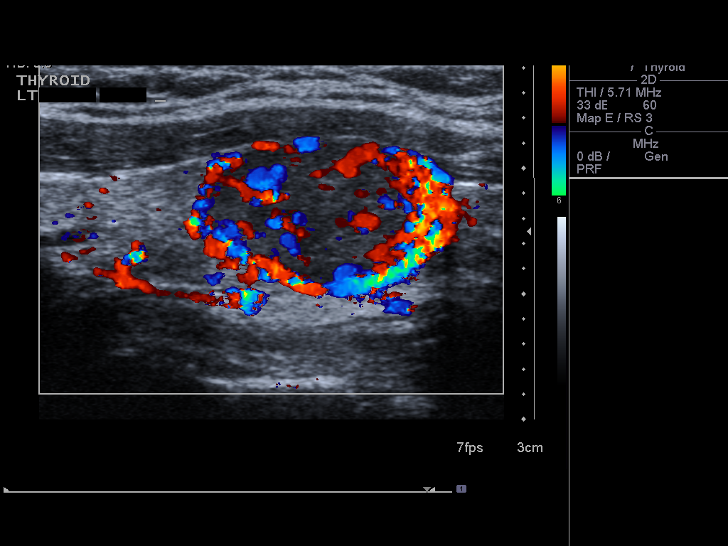
[im 4/15]
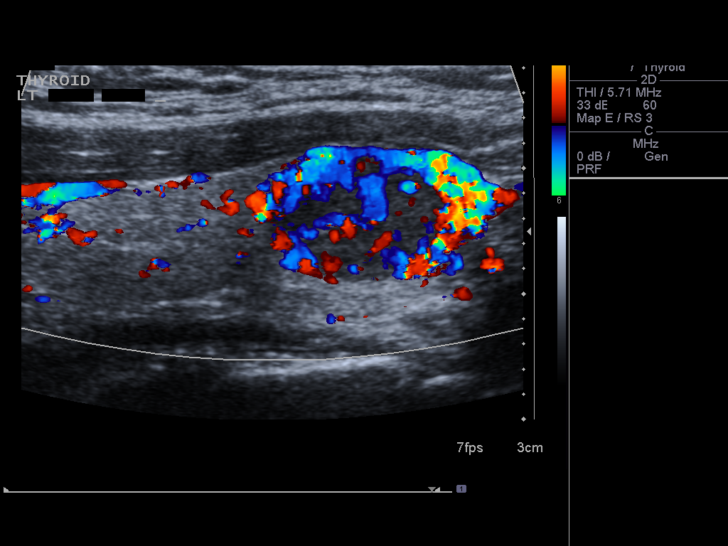
[im 5/15]
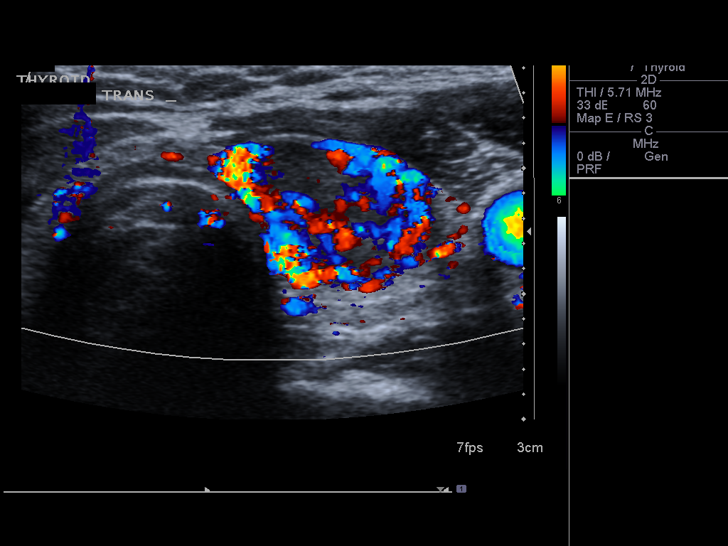
[im 6/15]
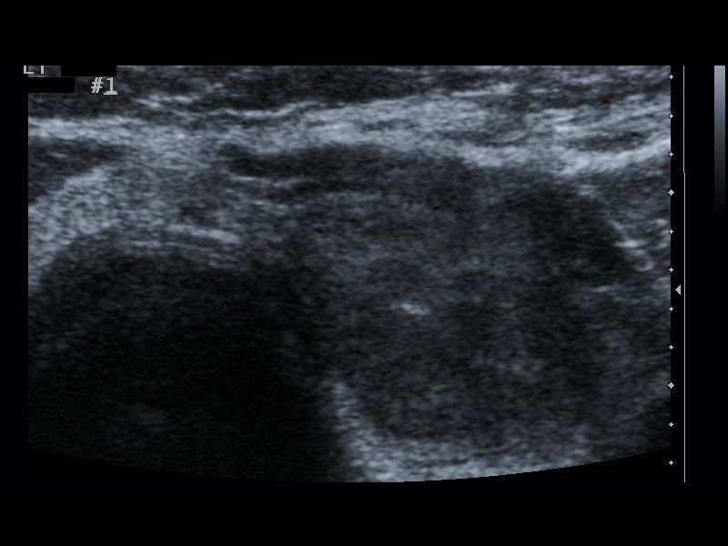
[im 7/15]
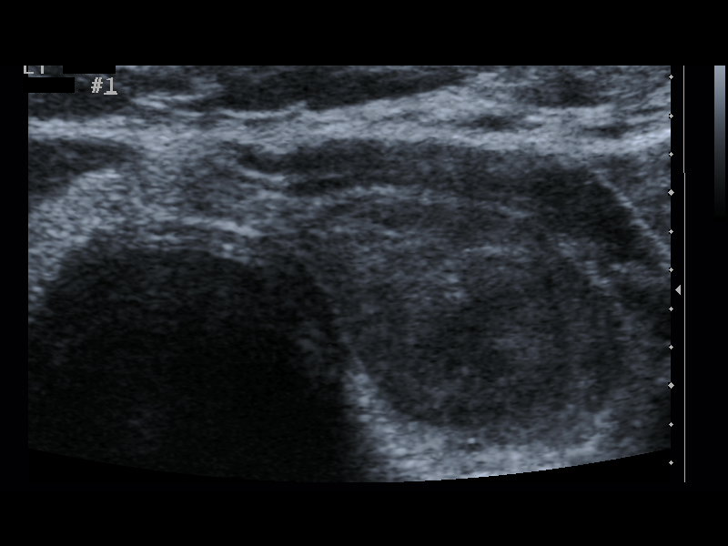
[im 9/15]
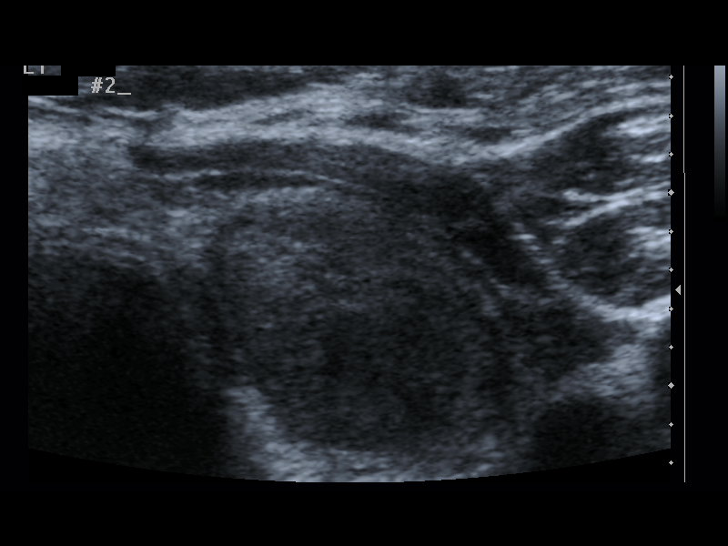
[im 10/15]
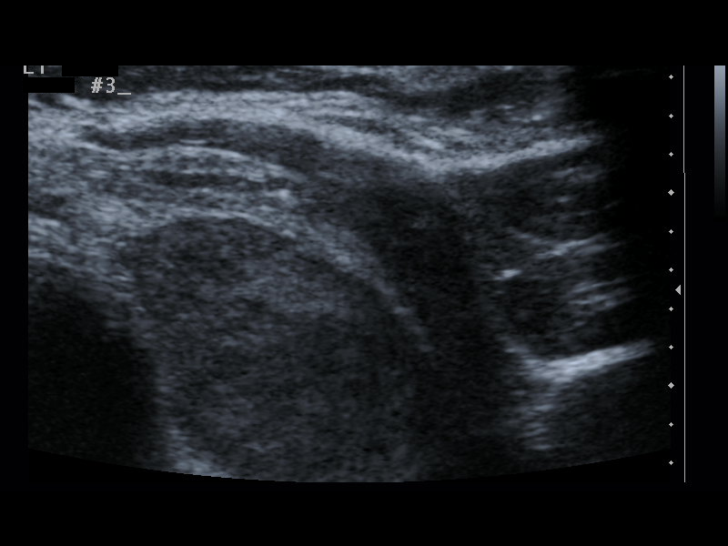
[im 11/15]
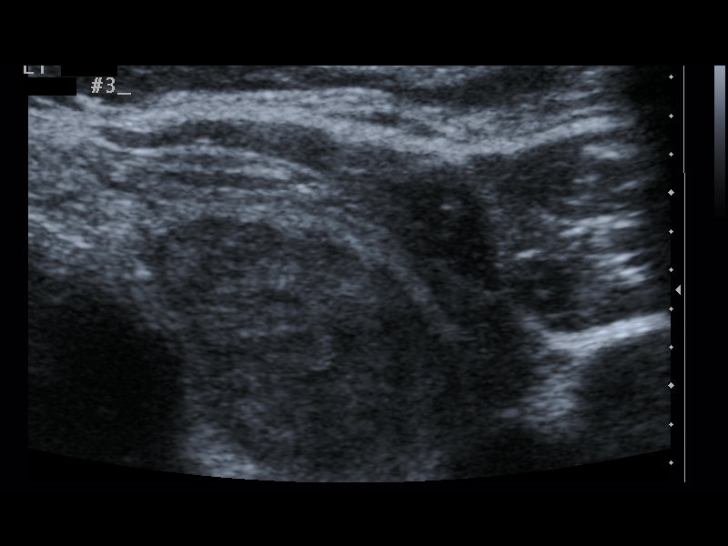
[im 12/15]
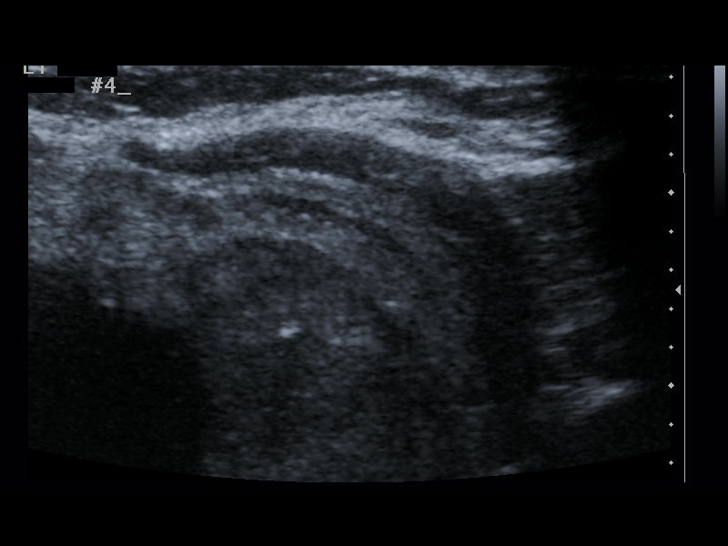
[im 13/15]
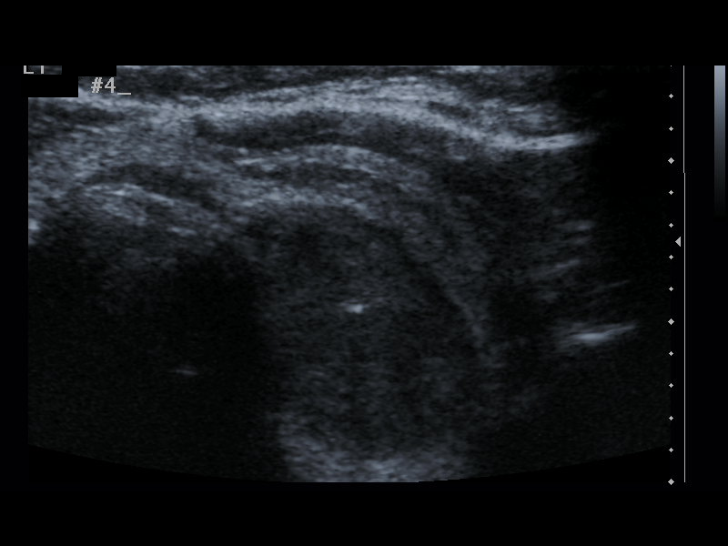
[im 14/15]
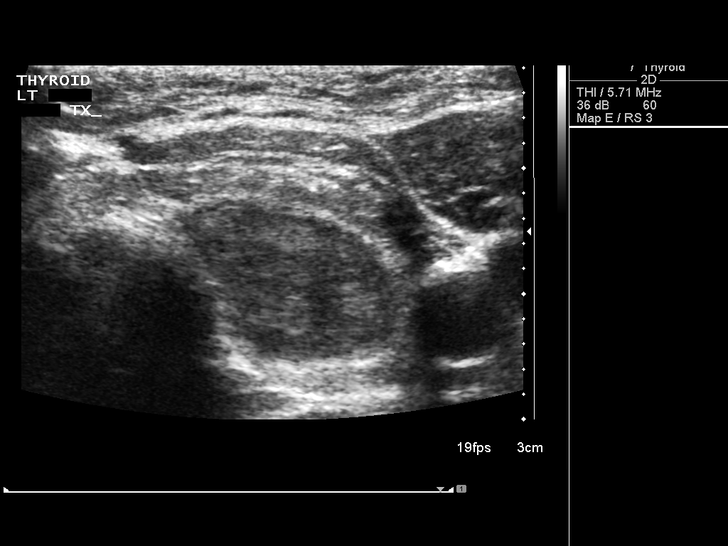
[im 15/15]
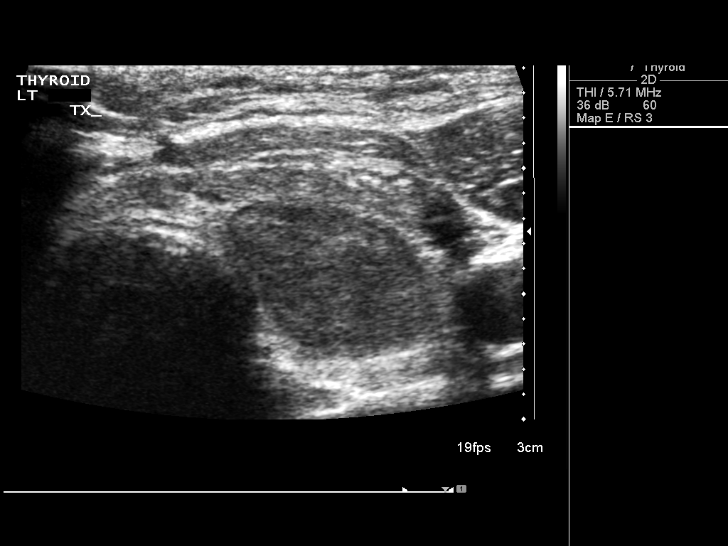

[14 of 15 positions shown; findings below may reference images not displayed]

Survey ultrasound was performed and the dominant lesion in the
inferior left lobe was localized. An appropriate skin entry site was
determined. Skin was marked, then prepped with Betadine, draped in
usual sterile fashion, and infiltrated locally with 1% lidocaine.
Under real-time ultrasound guidance, 4 passes were made into the
lesion with 25 gauge needles. The patient tolerated procedure well,
with no immediate complications.
IMPRESSION: 1. Technically successful ultrasound-guided thyroid aspiration
biopsy , solitary left nodule

## 2014-08-18 ENCOUNTER — Encounter: Payer: Self-pay | Admitting: Neurology

## 2014-08-18 ENCOUNTER — Ambulatory Visit (INDEPENDENT_AMBULATORY_CARE_PROVIDER_SITE_OTHER): Payer: BC Managed Care – PPO | Admitting: Neurology

## 2014-08-18 VITALS — BP 116/84 | HR 74 | Resp 16 | Ht 68.0 in | Wt 222.0 lb

## 2014-08-18 DIAGNOSIS — R404 Transient alteration of awareness: Secondary | ICD-10-CM

## 2014-08-18 DIAGNOSIS — E210 Primary hyperparathyroidism: Secondary | ICD-10-CM | POA: Diagnosis not present

## 2014-08-18 DIAGNOSIS — C73 Malignant neoplasm of thyroid gland: Secondary | ICD-10-CM | POA: Diagnosis not present

## 2014-08-18 DIAGNOSIS — R202 Paresthesia of skin: Secondary | ICD-10-CM | POA: Diagnosis not present

## 2014-08-18 NOTE — Progress Notes (Signed)
[facility]  PATIENT: Cathy Galvan DOB: [birth year]  REFERRING DOCTOR OR PCP:  Referred by [facility] Fax [phone]).  PCP is Kathlene November SOURCE: patient and records/labs form PCP in EMR and Dr. Dara Lords  _________________________________   HISTORICAL  CHIEF COMPLAINT:  Chief Complaint  Patient presents with  . Numbness    Sts. onset of numbness in both hands/all fingers, and both feet/all toes since left hemithyroidectomy in 05-22-14.  Sts. numbness is intermittent, is some better now than when it first started.  Sts. over the last year, she has had mult. episodes of feeling like she is "blinking out--like a computer looks like it's going out".  Sts. she doesn't feel like she's going to pass out--is unable to be more specific./fim    HISTORY OF PRESENT ILLNESS:  I had the pleasure seeing you patient, Cathy Galvan, at [facility] for neurologic consultation regarding her paresthesias.    Symptoms started right after a left hemithyroidectomy (with removal of one parathyroid gland) in April 2016.   Tingling is in the hands and feet most of the time and in the lips and clitoris when more severe.    Numbness is usually symmetric but sometimes just the hands or just the feet will be involved.  She feels symptoms are a little better now than in April.   Ionized calcium was reportedly slightly low about a month after her surgery.    She notes that if she takes Calcium (usually Ca caltrate) her tingling gets better.   She has also had slightly elevated TSH this year on one testing (but not others).      She denies any hyperventilation and diet is fairly normal except she tries to do low carbohydrate.     She also reports episodes where she feels a few seconds go by without her knowing about it.   She does not think she loses consciousness (has occurred while driving) and does not have any jerking or seizure activity.    Some of  these spells have occurred when she noted more tingling.      She has had some urinary frequency x years and doe snot think this has worsened since the surgery.     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   She had surgery for this.  No FH of this.       REVIEW OF SYSTEMS: Constitutional: No fevers, chills, sweats, or change in appetite Eyes: No visual changes, double vision, eye pain Ear, nose and throat: No hearing loss, ear pain, nasal congestion, sore throat Cardiovascular: No chest pain, palpitations Respiratory: No shortness of breath at rest or with exertion.   No wheezes GastrointestinaI: No nausea, vomiting, diarrhea, abdominal pain, fecal incontinence Genitourinary: No dysuria, urinary retention or frequency.  No nocturia. Musculoskeletal: No neck pain, back pain Integumentary: No rash, pruritus, skin lesions Neurological: as above Psychiatric: No depression at this time.  No anxiety Endocrine: No palpitations, diaphoresis, change in appetite, change in weigh or increased thirst Hematologic/Lymphatic: No anemia, purpura, petechiae. Allergic/Immunologic: No itchy/runny eyes, nasal congestion, recent allergic reactions, rashes  ALLERGIES: Allergies  Allergen Reactions  . Prilosec Otc [Omeprazole Magnesium]     Rash ( Note: she had poison ivy @ time also)  . Zantac [Ranitidine Hcl]     headache    HOME MEDICATIONS:  Current outpatient prescriptions:  .  AMBULATORY NON FORMULARY MEDICATION, Allergy Injections  , Disp: , Rfl:  .  Calcium-Vitamin D 600-200  MG-UNIT per tablet, Take by mouth., Disp: , Rfl:  .  levothyroxine (SYNTHROID, LEVOTHROID) 50 MCG tablet, Take by mouth., Disp: , Rfl:  .  Multiple Vitamin (MULTIVITAMIN) capsule, Take 1 capsule by mouth daily., Disp: , Rfl:  .  Cinnamon 500 MG capsule, Take 1,000 mg by mouth daily., Disp: , Rfl:  .  loratadine (CLARITIN) 10 MG tablet, Take 10 mg by mouth daily., Disp: , Rfl:  .  Lysine HCl (L-FORMULA  LYSINE HCL) 500 MG TABS, Take 2,000 mg by mouth as needed., Disp: , Rfl:  .  NASONEX 50 MCG/ACT nasal spray, Place 1 spray into the nose as needed., Disp: , Rfl:  .  VALTREX 500 MG tablet, One tablet twice a day for 3 days for each outbreak.Brand medically neccesary (Patient not taking: Reported on 08/18/2014), Disp: 30 tablet, Rfl: 6  PAST MEDICAL HISTORY: Past Medical History  Diagnosis Date  . Hypoglycemia   . Asthma     adult onset ; no childhood astma  . GERD (gastroesophageal reflux disease)     negative cardiac workup for chest pain 03/12  . MVP (mitral valve prolapse) 1973    as per ECHO ; not found on ECHO 03/12  . Herpes genitalis   . Migraine     rarely  . Allergy   . Anemia   . Anxiety     ONLY WITH LOW BLOOD SUGAR  . Heart murmur   . 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 Primary hyperparathyroidism 05-2014    PAST SURGICAL HISTORY: Past Surgical History  Procedure Laterality Date  . Eye surgery      DUANE syndrome-Left eye; decreased meial &lateral rotation OS  . Fracture surgery      Left Arm   . Urethra surgery      dilation   . Thyroidectomy, partial  03-2438    @ [facility] (follicular thyroid cancer)  . Parahtyroid gland resection  05-2014    FAMILY HISTORY: Family History  Problem Relation Age of Onset  . Diabetes Father   . Obesity Father   . Hypertension Father   . Hyperlipidemia Father   . Colon polyps Father   . Heart disease Maternal Grandfather   . Dementia Maternal Grandfather   . Colon cancer Paternal Grandfather     GF dx 47s  . Thyroid disease Paternal Aunt   . Colon polyps Paternal Aunt   . Lung cancer Maternal Grandmother     smoker 40 + years   . Breast cancer Paternal Aunt   . Colon cancer Maternal Uncle   . Kidney disease Mother     SOCIAL HISTORY:  History   Social History  . Marital Status: Married    Spouse Name: N/A  . Number of Children: 0  . Years of Education: N/A   Occupational History  . instructor [employer]    Social History Main Topics  . Smoking status: Former Smoker    Quit date: 01/30/1993  . Smokeless tobacco: Never Used     Comment: 17 years ago as of 2012  . Alcohol Use: No  . Drug Use: No  . Sexual Activity: Not on file   Other Topics Concern  . Not on file   Social History Narrative   Lives w/ husband      PHYSICAL EXAM  Filed Vitals:   08/18/14 1506  BP: 116/84  Pulse: 74  Resp: 16  Height: 5' 8"  (1.727 m)  Weight: 222 lb (100.699  kg)    Body mass index is 33.76 kg/(m^2).   General: The patient is well-developed and well-nourished and in no acute distress  Neck: The neck is supple, no carotid bruits are noted.  The neck is nontender.  Cardiovascular: The heart has a regular rate and rhythm with a normal S1 and S2. There were no murmurs, gallops or rubs. Lungs are clear to auscultation.  Skin: Extremities are without significant edema.  Musculoskeletal:  Back is nontender  Neurologic Exam  Mental status: The patient is alert and oriented x 3 at the time of the examination. The patient has apparent normal recent and remote memory, with an apparently normal attention span and concentration ability.   Speech is normal.  Cranial nerves: Extraocular movements show reduced abduction on the left. Pupils are equal, round, and reactive to light and accomodation.  Visual fields are full.  Facial symmetry is present. There is good facial sensation to soft touch bilaterally.Facial strength is normal.  Trapezius and sternocleidomastoid strength is normal. No dysarthria is noted.  The tongue is midline, and the patient has symmetric elevation of the soft palate. No obvious hearing deficits are noted.  Motor:  Muscle bulk is normal.   Tone is normal. Strength is  5 / 5 in all 4 extremities except 4+/5 - 5-/5 in EHL foot muscles  Sensory: Sensory testing is intact to pinprick, soft touch and vibration sensation in the arms but she reports decreased sensation to touch in  the feet.  Vibration sensation was normal in the feet..  Coordination: Cerebellar testing reveals good finger-nose-finger and heel-to-shin bilaterally.  Gait and station: Station is normal.   Gait is normal. Tandem gait is normal. Romberg is negative.   Reflexes: Deep tendon reflexes are symmetric and normal bilaterally.   Plantar responses are flexor.    DIAGNOSTIC DATA (LABS, IMAGING, TESTING) - I reviewed patient records, labs, notes, testing and imaging myself where available.  Lab Results  Component Value Date   WBC 5.9 06/30/2014   HGB 13.2 06/30/2014   HCT 38.3 06/30/2014   MCV 90.5 06/30/2014   PLT 231.0 06/30/2014      Component Value Date/Time   NA 136 06/30/2014 1437   K 3.7 06/30/2014 1437   CL 104 06/30/2014 1437   CO2 27 06/30/2014 1437   GLUCOSE 81 06/30/2014 1437   BUN 11 06/30/2014 1437   CREATININE 0.93 06/30/2014 1437   CALCIUM 9.2 06/30/2014 1437   PROT 7.1 06/30/2014 1437   ALBUMIN 4.3 06/30/2014 1437   AST 21 06/30/2014 1437   ALT 16 06/30/2014 1437   ALKPHOS 59 06/30/2014 1437   BILITOT 0.3 06/30/2014 1437   Lab Results  Component Value Date   CHOL 180 06/10/2013   HDL 56.60 06/10/2013   LDLCALC 118* 06/10/2013   TRIG 28.0 06/10/2013   CHOLHDL 3 06/10/2013   No results found for: HGBA1C No results found for: VITAMINB12 Lab Results  Component Value Date   TSH 3.35 06/30/2014       ASSESSMENT AND PLAN  Paresthesia - Plan: NCV with EMG(electromyography), Calcium, ionized, Sedimentation rate, ANA w/Reflex, Multiple myeloma panel, serum  Primary hyperparathyroidism - Plan: Calcium, ionized  Thyroid cancer - Plan: Calcium, ionized  Transient alteration of awareness   In summary, Cathy Galvan is a 44 year old woman who has had paresthesias and thyroid and parathyroid surgery in April. She believes that the symptoms are due to her calcium level but her ionized calcium was normal when it was checked.  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 I will check an NCV/EMG to better assess this and also check blood work for vasculitis, monoclonal gammopathies and ionized calcium.   To avoid kidney problems, her endocrinologist has advised her not to take high doses of calcium and I have advised her to follow their advice in this matter.   Her second problem is the very short transient alteration of awareness episodes. This does not appear to be seizure related and may just be attentional or due to distraction. If the spells persist, I will check an EEG to make sure that there is not any epileptiform activity.  She will return to see me for the NCV/EMG study sooner if there are new or worsening symptoms.  Thank you for asking me to see Ms. Broxterman for a neurologic consultation.  Rosine Solecki A. Felecia Shelling, MD, PhD 8/82/8003, 4:91 PM Certified in Neurology, Clinical Neurophysiology, Sleep Medicine, Pain Medicine and Neuroimaging  [facility] [address], [place], [place] [address] [phone]

## 2014-08-20 ENCOUNTER — Telehealth: Payer: Self-pay | Admitting: *Deleted

## 2014-08-20 DIAGNOSIS — R202 Paresthesia of skin: Secondary | ICD-10-CM

## 2014-08-20 LAB — MULTIPLE MYELOMA PANEL, SERUM
ALPHA 1: 0.2 g/dL (ref 0.0–0.4)
Albumin SerPl Elph-Mcnc: 4.1 g/dL (ref 2.9–4.4)
Albumin/Glob SerPl: 1.6 (ref 0.7–1.7)
Alpha2 Glob SerPl Elph-Mcnc: 0.5 g/dL (ref 0.4–1.0)
B-Globulin SerPl Elph-Mcnc: 1 g/dL (ref 0.7–1.3)
Gamma Glob SerPl Elph-Mcnc: 1 g/dL (ref 0.4–1.8)
Globulin, Total: 2.7 g/dL (ref 2.2–3.9)
IGA/IMMUNOGLOBULIN A, SERUM: 134 mg/dL (ref 87–352)
IGG (IMMUNOGLOBIN G), SERUM: 1083 mg/dL (ref 700–1600)
IgM (Immunoglobulin M), Srm: 118 mg/dL (ref 26–217)
Total Protein: 6.8 g/dL (ref 6.0–8.5)

## 2014-08-20 LAB — SEDIMENTATION RATE: SED RATE: 2 mm/h (ref 0–32)

## 2014-08-20 LAB — ENA+DNA/DS+SJORGEN'S
ENA RNP Ab: 0.4 AI (ref 0.0–0.9)
ENA SSB (LA) AB: 2.6 AI — AB (ref 0.0–0.9)

## 2014-08-20 LAB — ANA W/REFLEX: Anti Nuclear Antibody(ANA): POSITIVE — AB

## 2014-08-20 LAB — CALCIUM, IONIZED: Calcium, Ion: 5.2 mg/dL (ref 4.5–5.6)

## 2014-08-20 NOTE — Telephone Encounter (Signed)
-----   Message from Britt Bottom, MD sent at 08/19/2014  6:06 PM EDT ----- Pleas let her know the ionized calcium was normal.    The ANA screening test showed antibodies sometimes associated with Sjogren's .  Screening test is not always accurate but since she has some numbness I would also like her to see rheumatology for evaluation of possible Sjogren's disease

## 2014-08-20 NOTE — Telephone Encounter (Signed)
I have spoken with Cathy Galvan this morning and per RAS, given abnormal ana result, and advised that although not always accurate, this is sometimes assoc. with Sjogren's.  She denies dry eyes or dry mouth, but is agreeable to rheumatology referral for further eval.  I have ordered referral/fim

## 2014-09-08 ENCOUNTER — Telehealth: Payer: Self-pay

## 2014-09-08 NOTE — Telephone Encounter (Signed)
LVM for pt informing her that she has appt with [facility]... September 17, 2014 @ 9 am their contact [phone]. [phone] pt can call office with any questions

## 2014-09-17 DIAGNOSIS — E890 Postprocedural hypothyroidism: Secondary | ICD-10-CM | POA: Insufficient documentation

## 2014-09-17 DIAGNOSIS — E162 Hypoglycemia, unspecified: Secondary | ICD-10-CM | POA: Insufficient documentation

## 2014-10-07 ENCOUNTER — Telehealth: Payer: Self-pay | Admitting: Neurology

## 2014-10-07 DIAGNOSIS — R202 Paresthesia of skin: Secondary | ICD-10-CM

## 2014-10-07 NOTE — Telephone Encounter (Signed)
Patient called to request referral to [facility] Fax# [phone] Attn Joelene Millin vs. The other practice [facility] referred her to.

## 2014-10-08 NOTE — Telephone Encounter (Signed)
I have spoken with Cathy Galvan this morning.  She sts. she has decided she would rather referral for r/o Sjogren's be sent to [facility].  I have ordered a new referral to be sent to [facility]/fim

## 2014-10-28 ENCOUNTER — Encounter: Payer: BC Managed Care – PPO | Admitting: Neurology

## 2014-10-30 ENCOUNTER — Ambulatory Visit (INDEPENDENT_AMBULATORY_CARE_PROVIDER_SITE_OTHER): Payer: BC Managed Care – PPO | Admitting: Cardiology

## 2014-10-30 ENCOUNTER — Encounter: Payer: Self-pay | Admitting: Cardiology

## 2014-10-30 VITALS — BP 118/86 | HR 65 | Ht 68.0 in | Wt 225.0 lb

## 2014-10-30 DIAGNOSIS — Z23 Encounter for immunization: Secondary | ICD-10-CM | POA: Diagnosis not present

## 2014-10-30 DIAGNOSIS — R9431 Abnormal electrocardiogram [ECG] [EKG]: Secondary | ICD-10-CM | POA: Insufficient documentation

## 2014-10-30 NOTE — Addendum Note (Signed)
Addended by: Golden Hurter D on: 10/30/2014 03:33 PM   Modules accepted: Orders

## 2014-10-30 NOTE — Patient Instructions (Signed)
We will get your old records and call with the results of your prior Echo

## 2014-10-30 NOTE — Progress Notes (Signed)
Cardiology Office Note   Date:  10/30/2014   ID:  Cathy Galvan, DOB March 05, 1970, MRN [id number]  PCP:  Kathlene November, MD  Cardiologist:   Peter Martinique, MD   Chief Complaint  Patient presents with  . New Evaluation    pt states she had chest pain but think its heart burn  . Edema    pt thinks swelling is in her hands and feet      History of Present Illness: Cathy Galvan is a 44 y.o. female who presents for evaluation of abnormal Ecg. She apparently saw me 6 yrs ago for atypical chest pain. She carried a diagnosis of mitral valve prolapse but Echo was done at that time and showed no prolapse. 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 She is s/p partial thyroidectomy and is on thyroid replacement. She became concerned about an Ecg finding that she had at [facility]. The report mentions RSR' in V1 and LAE. Otherwise normal. She states she still has intermittent chest pain c/w GERD. Takes TUMS regularly. No cardiac complaints.     Past Medical History  Diagnosis Date  . Hypoglycemia   . Asthma     adult onset ; no childhood astma  . GERD (gastroesophageal reflux disease)     negative cardiac workup for chest pain 03/12  . MVP (mitral valve prolapse) 1973    as per ECHO ; not found on ECHO 03/12  . Herpes genitalis   . Migraine     rarely  . Allergy   . Anemia   . Anxiety     ONLY WITH LOW BLOOD SUGAR  . Heart murmur   . Thyroid cancer 03-8099    follicular   . Primary hyperparathyroidism 05-2014    Past Surgical History  Procedure Laterality Date  . Eye surgery      DUANE syndrome-Left eye; decreased meial &lateral rotation OS  . Fracture surgery      Left Arm   . Urethra surgery      dilation   . Thyroidectomy, partial  07-5100    @ [facility] (follicular thyroid cancer)  . Parahtyroid gland resection  05-2014     Current Outpatient Prescriptions  Medication Sig Dispense Refill  . AMBULATORY NON FORMULARY MEDICATION Allergy Injections      .  calcium carbonate (TUMS EX) 750 MG chewable tablet Chew 1 tablet by mouth daily.    . calcium-vitamin D (OSCAL) 250-125 MG-UNIT tablet Take 1 tablet by mouth daily.    . Calcium-Vitamin D 600-200 MG-UNIT per tablet Take by mouth.    . levothyroxine (SYNTHROID, LEVOTHROID) 25 MCG tablet Take 25 mcg by mouth daily before breakfast.    . Multiple Vitamin (MULTIVITAMIN) capsule Take 1 capsule by mouth daily.    Marland Kitchen NASONEX 50 MCG/ACT nasal spray Place 1 spray into the nose as needed.     No current facility-administered medications for this visit.    Allergies:   Prilosec otc and Zantac    Social History:  The patient  reports that she quit smoking about 21 years ago. She has never used smokeless tobacco. She reports that she does not drink alcohol or use illicit drugs.   Family History:  The patient's family history includes Breast cancer in her paternal aunt; Colon cancer in her maternal uncle and paternal grandfather; Colon polyps in her father and paternal aunt; Dementia in her maternal grandfather; Diabetes in her father; Heart disease in her maternal grandfather; Hyperlipidemia in her  father; Hypertension in her father; Kidney disease in her mother; Lung cancer in her maternal grandmother; Obesity in her father; Thyroid disease in her paternal aunt.    ROS:  Please see the history of present illness.   Otherwise, review of systems are positive for none.   All other systems are reviewed and negative.    PHYSICAL EXAM: VS:  BP 118/86 mmHg  Pulse 65  Ht 5' 8"  (1.727 m)  Wt 102.059 kg (225 lb)  BMI 34.22 kg/m2 , BMI Body mass index is 34.22 kg/(m^2). GEN: Well nourished, well developed, in no acute distress HEENT: normal Neck: no JVD, carotid bruits, or masses Cardiac: RRR; no murmurs, rubs, or gallops,no edema  Respiratory:  clear to auscultation bilaterally, normal work of breathing GI: soft, nontender, nondistended, + BS MS: no deformity or atrophy Skin: warm and dry, no rash Neuro:   Strength and sensation are intact Psych: euthymic mood, full affect   EKG:  EKG is ordered today. The ekg ordered today demonstrates NSR with possible LAE. Minimal RSR' in V1- normal variant.   Recent Labs: 06/30/2014: ALT 16; BUN 11; Creatinine, Ser 0.93; Hemoglobin 13.2; Platelets 231.0; Potassium 3.7; Sodium 136; TSH 3.35    Lipid Panel    Component Value Date/Time   CHOL 180 06/10/2013 1111   TRIG 28.0 06/10/2013 1111   HDL 56.60 06/10/2013 1111   CHOLHDL 3 06/10/2013 1111   VLDL 5.6 06/10/2013 1111   LDLCALC 118* 06/10/2013 1111      Wt Readings from Last 3 Encounters:  10/30/14 102.059 kg (225 lb)  08/18/14 100.699 kg (222 lb)  06/30/14 99.791 kg (220 lb)      Other studies Reviewed: Additional studies/ records that were reviewed today include: None Review of the above records demonstrates: N/A   ASSESSMENT AND PLAN:  1.  Normal variant Ecg. Does not meet criteria for LAE. We will obtain a copy of her prior cardiac evaluation and Echo and review. Will report back to her. Otherwise reassure. No further cardiac evaluation needed.   Current medicines are reviewed at length with the patient today.  The patient does not have concerns regarding medicines.  The following changes have been made:  no change  Labs/ tests ordered today include:  Orders Placed This Encounter  Procedures  . EKG 12-Lead     Disposition:   FU PRN  Signed, Peter Martinique, MD  10/30/2014 3:04 PM    [facility] [address], [place], [place], [address] Phone [phone], Fax [phone]

## 2014-12-22 ENCOUNTER — Telehealth: Payer: Self-pay | Admitting: Cardiology

## 2014-12-22 NOTE — Telephone Encounter (Signed)
She said she was waiting to hear from you concerning her records from [facility].

## 2014-12-22 NOTE — Telephone Encounter (Signed)
Returned call to patient.Advised I will speak to Dr.Jordan tomorrow 12/23/14 and call you back.

## 2014-12-31 NOTE — Telephone Encounter (Signed)
Returned call to patient spoke to [place] he advised prior 04/14/10 echo ok.

## 2015-07-05 ENCOUNTER — Telehealth: Payer: Self-pay | Admitting: Internal Medicine

## 2015-07-05 DIAGNOSIS — Z0000 Encounter for general adult medical examination without abnormal findings: Secondary | ICD-10-CM

## 2015-07-05 NOTE — Telephone Encounter (Signed)
Recommend: CMP, CBC, FLP. Dx [id number]  Also get a rainbow in case we need to add more labs.

## 2015-07-05 NOTE — Telephone Encounter (Signed)
Labs ordered, please call Pt and schedule appt at her convenience.

## 2015-07-05 NOTE — Telephone Encounter (Signed)
Can be reached: [phone]   Reason for call: pt states that she is wanting to get her labs done tomorrow morning (cpe is scheduled for 3:30 in the afternoon). She cannot fast thru the day due to her blood sugars. Pt will be out of town the following day. She said that she will like all the tests she had done 5/31 except calcium and parathyroid. Please let me know if I can call pt to come in tomorrow morning.

## 2015-07-05 NOTE — Telephone Encounter (Signed)
Scheduled pt

## 2015-07-05 NOTE — Telephone Encounter (Signed)
Please advise 

## 2015-07-06 ENCOUNTER — Other Ambulatory Visit: Payer: Self-pay | Admitting: Internal Medicine

## 2015-07-06 ENCOUNTER — Ambulatory Visit (INDEPENDENT_AMBULATORY_CARE_PROVIDER_SITE_OTHER): Payer: BC Managed Care – PPO | Admitting: Internal Medicine

## 2015-07-06 ENCOUNTER — Other Ambulatory Visit (INDEPENDENT_AMBULATORY_CARE_PROVIDER_SITE_OTHER): Payer: BC Managed Care – PPO

## 2015-07-06 ENCOUNTER — Encounter: Payer: Self-pay | Admitting: Internal Medicine

## 2015-07-06 VITALS — BP 124/72 | HR 83 | Temp 98.0°F | Ht 68.0 in | Wt 238.0 lb

## 2015-07-06 DIAGNOSIS — Z1231 Encounter for screening mammogram for malignant neoplasm of breast: Secondary | ICD-10-CM

## 2015-07-06 DIAGNOSIS — C73 Malignant neoplasm of thyroid gland: Secondary | ICD-10-CM

## 2015-07-06 DIAGNOSIS — Z0000 Encounter for general adult medical examination without abnormal findings: Secondary | ICD-10-CM

## 2015-07-06 DIAGNOSIS — Z85850 Personal history of malignant neoplasm of thyroid: Secondary | ICD-10-CM

## 2015-07-06 LAB — LIPID PANEL
CHOLESTEROL: 165 mg/dL (ref 0–200)
HDL: 52.4 mg/dL (ref >39.00)
LDL Cholesterol: 101 mg/dL — ABNORMAL HIGH (ref 0–99)
NONHDL: 112.65
Total CHOL/HDL Ratio: 3
Triglycerides: 58 mg/dL (ref 0.0–149.0)
VLDL: 11.6 mg/dL (ref 0.0–40.0)

## 2015-07-06 LAB — CBC WITH DIFFERENTIAL/PLATELET
BASOS PCT: 0.4 % (ref 0.0–3.0)
Basophils Absolute: 0 10*3/uL (ref 0.0–0.1)
EOS PCT: 1.5 % (ref 0.0–5.0)
Eosinophils Absolute: 0.1 10*3/uL (ref 0.0–0.7)
HCT: 38.9 % (ref 36.0–46.0)
Hemoglobin: 13.2 g/dL (ref 12.0–15.0)
LYMPHS ABS: 1.9 10*3/uL (ref 0.7–4.0)
Lymphocytes Relative: 35 % (ref 12.0–46.0)
MCHC: 34 g/dL (ref 30.0–36.0)
MCV: 90.5 fl (ref 78.0–100.0)
MONO ABS: 0.4 10*3/uL (ref 0.1–1.0)
MONOS PCT: 8.1 % (ref 3.0–12.0)
NEUTROS ABS: 3 10*3/uL (ref 1.4–7.7)
NEUTROS PCT: 55 % (ref 43.0–77.0)
PLATELETS: 198 10*3/uL (ref 150.0–400.0)
RBC: 4.29 Mil/uL (ref 3.87–5.11)
RDW: 12.7 % (ref 11.5–15.5)
WBC: 5.5 10*3/uL (ref 4.0–10.5)

## 2015-07-06 LAB — COMPREHENSIVE METABOLIC PANEL
ALK PHOS: 51 U/L (ref 39–117)
ALT: 14 U/L (ref 0–35)
AST: 17 U/L (ref 0–37)
Albumin: 4.2 g/dL (ref 3.5–5.2)
BUN: 15 mg/dL (ref 6–23)
CO2: 28 meq/L (ref 19–32)
Calcium: 9.4 mg/dL (ref 8.4–10.5)
Chloride: 100 mEq/L (ref 96–112)
Creatinine, Ser: 0.73 mg/dL (ref 0.40–1.20)
GFR: 91.81 mL/min (ref >60.00)
GLUCOSE: 88 mg/dL (ref 70–99)
POTASSIUM: 4.2 meq/L (ref 3.5–5.1)
SODIUM: 134 meq/L — AB (ref 135–145)
TOTAL PROTEIN: 6.8 g/dL (ref 6.0–8.3)
Total Bilirubin: 0.5 mg/dL (ref 0.2–1.2)

## 2015-07-06 LAB — TSH: TSH: 4.85 u[IU]/mL — AB (ref 0.35–4.50)

## 2015-07-06 NOTE — Progress Notes (Signed)
Pre visit review using our clinic review tool, if applicable. No additional management support is needed unless otherwise documented below in the visit note. 

## 2015-07-06 NOTE — Assessment & Plan Note (Addendum)
Td-- 2009  Reports she saw gyn 2016, no recent MMG, s/p cyst aspiration breast 2013, no malignant cells Rec a MMG , pt states she will call    GF had colon ca age ~ 56, her father is okay [facility]--03/2012--polyps--q 5--family hx--next 2019  Labs done today d/w pt, add TSH T3 T4 per patient request, will do but rec the f/u of results needs to be by endo. Diet and exercise discussed

## 2015-07-06 NOTE — Progress Notes (Signed)
Subjective:    Patient ID: Cathy Galvan, female    DOB: Jun 05, 1970, 45 y.o.   MRN: [id number]  DOS:  07/06/2015 Type of visit - description : CPX Interval history: in general doing well except for hypoglycemia   Review of Systems Reports hypoglycemia on and off, it causes a number of issues including dizziness, occasional headache, confusion, increase appetite. Also reports weight gain, she thinks related to her thyroid levels.  Constitutional: No fever. No chills. . No unusual sweats  HEENT: No dental problems, no ear discharge, no facial swelling, no voice changes. No eye discharge, no eye  redness , no  intolerance to light   Respiratory: No wheezing , no  difficulty breathing. No cough , no mucus production  Cardiovascular: No CP, no leg swelling , no  Palpitations  GI: no nausea, no vomiting, no diarrhea , no  abdominal pain.  No blood in the stools. No dysphagia, no odynophagia    Endocrine: No polyuria , no polydipsia  GU: No dysuria, gross hematuria, difficulty urinating. No urinary urgency, no frequency.  Musculoskeletal: No joint swellings or unusual aches or pains  Skin: No change in the color of the skin, palor , no  Rash  Allergic, immunologic: No environmental allergies , no  food allergies  Neurological: no  syncope.  No diplopia, no slurred, no slurred speech, no motor deficits, no facial  Numbness  Hematological: No enlarged lymph nodes, no easy bruising , no unusual bleedings  Psychiatry: No suicidal ideas, no hallucinations, no beavior problems No unusual/severe anxiety, no depression   Past Medical History  Diagnosis Date  . Hypoglycemia   . Asthma     adult onset ; no childhood astma  . GERD (gastroesophageal reflux disease)     negative cardiac workup for chest pain 03/12  . MVP (mitral valve prolapse) 1973    as per ECHO ; not found on ECHO 03/12  . Herpes genitalis   . Migraine     rarely  . Allergy   . Anemia   . Anxiety     ONLY  WITH LOW BLOOD SUGAR  . Heart murmur   . 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 Primary hyperparathyroidism ([place]) 05-2014  . Distal paresthesia   . Positive ANA (antinuclear antibody)     False positives  . SS-B antibody positive     False positives    Past Surgical History  Procedure Laterality Date  . Eye surgery      DUANE syndrome-Left eye; decreased meial &lateral rotation OS  . Fracture surgery      Left Arm   . Urethra surgery      dilation   . Thyroidectomy, partial  06-1441    @ [facility] (follicular thyroid cancer)  . Parahtyroid gland resection  05-2014    Social History   Social History  . Marital Status: Married    Spouse Name: N/A  . Number of Children: 0  . Years of Education: N/A   Occupational History  . 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 Smoking status: Former Smoker    Quit date: 01/30/1993  . Smokeless tobacco: Never Used     Comment: 17 years ago as of 2012  . Alcohol Use: No  . Drug Use: No  . Sexual Activity: Not on file   Other Topics Concern  . Not on file   Social History Narrative   Lives w/ husband  Family History  Problem Relation Age of Onset  . Diabetes Father   . Obesity Father   . Hypertension Father   . Hyperlipidemia Father   . Colon polyps Father   . Heart disease Maternal Grandfather   . Dementia Maternal Grandfather   . Colon cancer Paternal Grandfather     GF dx 105s  . Thyroid disease Paternal Aunt   . Colon polyps Paternal Aunt   . Lung cancer Maternal Grandmother     smoker 40 + years   . Breast cancer Paternal Aunt   . Colon cancer Maternal Uncle   . Kidney disease Mother        Medication List       This list is accurate as of: 07/06/15 11:59 PM.  Always use your most recent med list.               acarbose 25 MG tablet  Commonly known as:  PRECOSE  Take 1 tablet by mouth 3 (three) times daily.     AMBULATORY NON FORMULARY MEDICATION  Reported on 07/06/2015      calcium carbonate 750 MG chewable tablet  Commonly known as:  TUMS EX  Chew 2 tablets by mouth daily.     calcium-vitamin D 250-125 MG-UNIT tablet  Commonly known as:  OSCAL  Take 1 tablet by mouth daily. Reported on 07/06/2015     Calcium-Vitamin D 600-200 MG-UNIT tablet  Take by mouth. Reported on 07/06/2015     levalbuterol 45 MCG/ACT inhaler  Commonly known as:  XOPENEX HFA  Inhale 1-2 puffs into the lungs every 8 (eight) hours as needed for wheezing.     LEVOXYL 25 MCG tablet  Generic drug:  levothyroxine  Take 37.5 mcg by mouth daily before breakfast.     liothyronine 5 MCG tablet  Commonly known as:  CYTOMEL  Take 3.5 mcg by mouth daily.     multivitamin capsule  Take 1 capsule by mouth daily.     NASONEX 50 MCG/ACT nasal spray  Generic drug:  mometasone  Place 1 spray into the nose as needed. Reported on 07/06/2015     vitamin C 500 MG tablet  Commonly known as:  ASCORBIC ACID  Take 500 mg by mouth daily.     vitamin E 200 UNIT capsule  Generic drug:  vitamin E  Take 200 Units by mouth daily.           Objective:   Physical Exam BP 124/72 mmHg  Pulse 83  Temp(Src) 98 F (36.7 C) (Oral)  Ht 5' 8"  (1.727 m)  Wt 238 lb (107.956 kg)  BMI 36.20 kg/m2  SpO2 96%  LMP 06/27/2015 (Approximate) General:   Well developed, well nourished . NAD.  Neck: No  thyromegaly or mass on today's exam HEENT:  Normocephalic . Face symmetric, atraumatic Lungs:  CTA B Normal respiratory effort, no intercostal retractions, no accessory muscle use. Heart: RRR,  no murmur.  No pretibial edema bilaterally  Abdomen:  Not distended, soft, non-tender. No rebound or rigidity.   Skin: Exposed areas without rash. Not pale. Not jaundice Neurologic:  alert & oriented X3.  Speech normal, gait appropriate for age and unassisted Strength symmetric and appropriate for age.  Psych: Cognition and judgment appear intact.  Cooperative with normal attention span and concentration.    Behavior appropriate. No anxious or depressed appearing.     Assessment & Plan:   Assessment Reports hypoglycemia Anxiety "with hypoglycemia" Asthma, adult onset GERD ENDO: --Thyroid cancer ---  2016, surgery @ [facility] ---Primary hyperparathyroidism --- 2016 --- Sees Dr B. Henderson (endo) @ [facility] as of 07-2015 H/o  MVP, not found on echo 03-2010 Cards visit 10-2014 "normal variant EKG" H/o herpes H/o + ANA  PLAN: Hypoglycemia: Reports this issue causes a # of sx, per endo History of thyroid cancer, subsequent hypothyroidism:per endo RTC  1 year

## 2015-07-06 NOTE — Patient Instructions (Signed)
  GO TO THE FRONT DESK Schedule your next appointment for a  physical exam in one year

## 2015-07-07 ENCOUNTER — Encounter: Payer: BC Managed Care – PPO | Admitting: Internal Medicine

## 2015-07-07 DIAGNOSIS — Z09 Encounter for follow-up examination after completed treatment for conditions other than malignant neoplasm: Secondary | ICD-10-CM | POA: Insufficient documentation

## 2015-07-07 LAB — T4, FREE
FREE T4: 0.77 ng/dL (ref 0.60–1.60)
Free T4: 0.7 ng/dL (ref 0.60–1.60)

## 2015-07-07 LAB — T3, FREE
T3, Free: 3.3 pg/mL (ref 2.3–4.2)
T3, Free: 3.1 pg/mL (ref 2.3–4.2)

## 2015-07-07 LAB — TSH: TSH: 2.65 u[IU]/mL (ref 0.35–4.50)

## 2015-07-07 NOTE — Assessment & Plan Note (Signed)
Hypoglycemia: Reports this issue causes a # of sx, per endo History of thyroid cancer, subsequent hypothyroidism:per endo RTC  1 year

## 2015-07-22 ENCOUNTER — Ambulatory Visit: Payer: BC Managed Care – PPO

## 2015-12-09 ENCOUNTER — Other Ambulatory Visit: Payer: Self-pay | Admitting: Obstetrics

## 2016-07-06 ENCOUNTER — Ambulatory Visit (INDEPENDENT_AMBULATORY_CARE_PROVIDER_SITE_OTHER): Payer: BC Managed Care – PPO | Admitting: Internal Medicine

## 2016-07-06 ENCOUNTER — Encounter: Payer: Self-pay | Admitting: Internal Medicine

## 2016-07-06 VITALS — BP 122/78 | HR 100 | Temp 98.6°F | Resp 14 | Ht 68.0 in

## 2016-07-06 DIAGNOSIS — Z0000 Encounter for general adult medical examination without abnormal findings: Secondary | ICD-10-CM

## 2016-07-06 DIAGNOSIS — C73 Malignant neoplasm of thyroid gland: Secondary | ICD-10-CM

## 2016-07-06 NOTE — Patient Instructions (Signed)
   GO TO THE FRONT DESK Schedule your next appointment for a  Physical in 1 year  Schedule labs to be done in the next few days, fasting

## 2016-07-06 NOTE — Assessment & Plan Note (Addendum)
--  Td: 2009 --Female care: Per [facility], last PAP 2016, no mammograms since 2013, + FH breast ca, rec a MMG, offered to schedule one but declined,  encouraged to d/w gyn --CCS: + FH (GF  age ~ 10) CCS--03/2012: polyps, Bx benign colonic mucosae, was rx  Redo cscope by 2019 --Diet and exercise discussed --Labs: CMP, CBC, TSH, FLP. --In addition to routine labs, she has specifically asked for a total T4, total T3, free T3, free T4, CRP, homocysteine level, fructosamine. She is aware that  some of those results need to be interpreted by the specialist, states she will discuss that with endocrinology.

## 2016-07-06 NOTE — Progress Notes (Signed)
Subjective:    Patient ID: Cathy Galvan, female    DOB: September 05, 1970, 46 y.o.   MRN: [id number]  DOS:  07/06/2016 Type of visit - description : cpx Interval history: She follow-up with endocrinology regularly. Here for a CPX   Review of Systems In general feeling well, has difficulties with her weight, occasional aches and pains and headaches but nothing unusual.   Other than above, a 14 point review of systems is negative      Past Medical History:  Diagnosis Date  . Allergy   . Anemia   . Anxiety    ONLY WITH LOW BLOOD SUGAR  . Asthma    adult onset ; no childhood astma  . Distal paresthesia   . GERD (gastroesophageal reflux disease)    negative cardiac workup for chest pain 03/12  . Heart murmur   . Herpes genitalis   . Hypoglycemia   . Migraine    rarely  . MVP (mitral valve prolapse) 1973   as per ECHO ; not found on ECHO 03/12  . Positive ANA (antinuclear antibody)    False positives  . Primary hyperparathyroidism ([place]) 05-2014  . SS-B antibody positive    False positives  . Thyroid cancer ([place]) 04-1581   follicular     Past Surgical History:  Procedure Laterality Date  . EYE SURGERY     DUANE syndrome-Left eye; decreased meial &lateral rotation OS  . FRACTURE SURGERY     Left Arm   . parahtyroid gland resection  05-2014  . THYROIDECTOMY, PARTIAL  0-9407   @ [facility] (follicular thyroid cancer)  . URETHRA SURGERY     dilation    Family History  Problem Relation Age of Onset  . Diabetes Father   . Obesity Father   . Hypertension Father   . Hyperlipidemia Father   . Colon polyps Father   . Heart disease Maternal Grandfather   . Dementia Maternal Grandfather   . Colon cancer Paternal Grandfather        GF dx 68s  . Lung cancer Maternal Grandmother        smoker 40 + years   . Kidney disease Mother   . Thyroid disease Paternal Aunt   . Colon polyps Paternal Aunt   . Breast cancer Paternal Aunt   . Colon cancer Maternal Uncle     Social  History   Social History  . Marital status: Married    Spouse name: N/A  . Number of children: 0  . Years of education: N/A   Occupational History  . instructor Chief Executive Officer.   Social History Main Topics  . Smoking status: Former Smoker    Quit date: 01/30/1993  . Smokeless tobacco: Never Used     Comment: 17 years ago as of 2012  . Alcohol use No  . Drug use: No  . Sexual activity: Not on file   Other Topics Concern  . Not on file   Social History Narrative   Lives w/ husband       Allergies as of 07/06/2016      Reactions   Prilosec Otc [omeprazole Magnesium]    Rash ( Note: she had poison ivy @ time also)   Metformin Other (See Comments)   Hypoglycemia symptoms   Latex Rash   Zantac [ranitidine Hcl]    headache      Medication List       Accurate as of 07/06/16  5:58 PM. Always use  your most recent med list.          calcium carbonate 750 MG chewable tablet Commonly known as:  TUMS EX Chew 1 tablet by mouth daily.   levalbuterol 45 MCG/ACT inhaler Commonly known as:  XOPENEX HFA Inhale 1-2 puffs into the lungs every 8 (eight) hours as needed for wheezing.   LEVOXYL 25 MCG tablet Generic drug:  levothyroxine Take 72.5 mcg by mouth daily before breakfast. Compounded drug          Objective:   Physical Exam BP 122/78 (BP Location: Right Arm, Patient Position: Sitting, Cuff Size: Normal)   Pulse 100   Temp 98.6 F (37 C) (Oral)   Resp 14   Ht 5' 8"  (1.727 m)   SpO2 97%   General:   Well developed, overweight appearing . NAD.  HEENT:  Normocephalic . Face symmetric, atraumatic Lungs:  CTA B Normal respiratory effort, no intercostal retractions, no accessory muscle use. Heart: RRR,  no murmur.  No pretibial edema bilaterally  Abdomen:  Not distended, soft, non-tender. No rebound or rigidity.   Skin: Exposed areas without rash. Not pale. Not jaundice Neurologic:  alert & oriented X3.  Speech normal, gait appropriate  for age and unassisted Strength symmetric and appropriate for age.  Psych: Cognition and judgment appear intact.  Cooperative with normal attention span and concentration.  Behavior appropriate. No anxious or depressed appearing.    Assessment & Plan:   Assessment Reports hypoglycemia Anxiety "with hypoglycemia" Asthma, adult onset GERD ENDO: --Thyroid cancer ---2016, surgery @ [facility] ---Primary hyperparathyroidism --- 2016 --- Sees Dr B. Henderson (endo) @ [facility] as of 07-2015 H/o  MVP, not found on echo 03-2010 Cards visit 10-2014 "normal variant EKG" H/o herpes H/o + ANA  PLAN: Hypoglycemia, thyroid cancer, hyperparathyroidism:  F/u  per endo. Current thyroid supplements, levothyroxine 12.5 mcg and  "thyroid compound" 60 mcg daily. Asthma: Well controlled, hardly ever uses albuterol. RTC 1 year

## 2016-07-06 NOTE — Assessment & Plan Note (Signed)
Hypoglycemia, thyroid cancer, hyperparathyroidism:  F/u  per endo. Current thyroid supplements, levothyroxine 12.5 mcg and  "thyroid compound" 60 mcg daily. Asthma: Well controlled, hardly ever uses albuterol. RTC 1 year

## 2016-07-06 NOTE — Progress Notes (Signed)
Pre visit review using our clinic review tool, if applicable. No additional management support is needed unless otherwise documented below in the visit note. 

## 2016-07-18 ENCOUNTER — Other Ambulatory Visit: Payer: BC Managed Care – PPO

## 2016-09-18 ENCOUNTER — Other Ambulatory Visit (INDEPENDENT_AMBULATORY_CARE_PROVIDER_SITE_OTHER): Payer: BC Managed Care – PPO

## 2016-09-18 DIAGNOSIS — Z0000 Encounter for general adult medical examination without abnormal findings: Secondary | ICD-10-CM | POA: Diagnosis not present

## 2016-09-18 DIAGNOSIS — C73 Malignant neoplasm of thyroid gland: Secondary | ICD-10-CM | POA: Diagnosis not present

## 2016-09-18 LAB — CBC WITH DIFFERENTIAL/PLATELET
BASOS ABS: 0 10*3/uL (ref 0.0–0.1)
Basophils Relative: 0.5 % (ref 0.0–3.0)
EOS ABS: 0.1 10*3/uL (ref 0.0–0.7)
Eosinophils Relative: 2 % (ref 0.0–5.0)
HEMATOCRIT: 39.3 % (ref 36.0–46.0)
Hemoglobin: 13.6 g/dL (ref 12.0–15.0)
LYMPHS ABS: 1.7 10*3/uL (ref 0.7–4.0)
LYMPHS PCT: 37.4 % (ref 12.0–46.0)
MCHC: 34.5 g/dL (ref 30.0–36.0)
MCV: 90 fl (ref 78.0–100.0)
MONOS PCT: 9.4 % (ref 3.0–12.0)
Monocytes Absolute: 0.4 10*3/uL (ref 0.1–1.0)
NEUTROS ABS: 2.3 10*3/uL (ref 1.4–7.7)
NEUTROS PCT: 50.7 % (ref 43.0–77.0)
PLATELETS: 214 10*3/uL (ref 150.0–400.0)
RBC: 4.37 Mil/uL (ref 3.87–5.11)
RDW: 12.7 % (ref 11.5–15.5)
WBC: 4.5 10*3/uL (ref 4.0–10.5)

## 2016-09-18 LAB — T3, FREE: T3 FREE: 3.2 pg/mL (ref 2.3–4.2)

## 2016-09-18 LAB — T4, FREE: FREE T4: 0.93 ng/dL (ref 0.60–1.60)

## 2016-09-18 LAB — LIPID PANEL
Cholesterol: 178 mg/dL (ref 0–200)
HDL: 48.5 mg/dL (ref >39.00)
LDL CALC: 109 mg/dL — AB (ref 0–99)
NONHDL: 129.51
TRIGLYCERIDES: 102 mg/dL (ref 0.0–149.0)
Total CHOL/HDL Ratio: 4
VLDL: 20.4 mg/dL (ref 0.0–40.0)

## 2016-09-18 LAB — COMPREHENSIVE METABOLIC PANEL
ALT: 14 U/L (ref 0–35)
AST: 13 U/L (ref 0–37)
Albumin: 3.9 g/dL (ref 3.5–5.2)
Alkaline Phosphatase: 46 U/L (ref 39–117)
BILIRUBIN TOTAL: 0.5 mg/dL (ref 0.2–1.2)
BUN: 10 mg/dL (ref 6–23)
CHLORIDE: 104 meq/L (ref 96–112)
CO2: 23 mEq/L (ref 19–32)
CREATININE: 0.67 mg/dL (ref 0.40–1.20)
Calcium: 9.1 mg/dL (ref 8.4–10.5)
GFR: 100.81 mL/min (ref >60.00)
GLUCOSE: 98 mg/dL (ref 70–99)
Potassium: 3.8 mEq/L (ref 3.5–5.1)
SODIUM: 135 meq/L (ref 135–145)
Total Protein: 7 g/dL (ref 6.0–8.3)

## 2016-09-18 LAB — TSH: TSH: 2.39 u[IU]/mL (ref 0.35–4.50)

## 2016-09-18 LAB — HEMOGLOBIN A1C: Hgb A1c MFr Bld: 5 % (ref 4.6–6.5)

## 2016-09-18 LAB — HIGH SENSITIVITY CRP: CRP HIGH SENSITIVITY: 1.24 mg/L (ref 0.000–5.000)

## 2016-09-19 LAB — T4: T4, Total: 8.3 ug/dL (ref 5.1–11.9)

## 2016-09-19 LAB — HOMOCYSTEINE: HOMOCYSTEINE: 9.1 umol/L (ref <10.4)

## 2016-09-19 LAB — T3: T3, Total: 109.4 ng/dL (ref 76–181)

## 2016-09-20 LAB — FRUCTOSAMINE: Fructosamine: 219 umol/L (ref 190–270)

## 2016-10-20 ENCOUNTER — Ambulatory Visit: Payer: BC Managed Care – PPO | Admitting: Gastroenterology

## 2017-04-07 ENCOUNTER — Encounter: Payer: Self-pay | Admitting: Gastroenterology

## 2017-07-11 ENCOUNTER — Encounter: Payer: BC Managed Care – PPO | Admitting: Internal Medicine
